# Patient Record
Sex: Female | Born: 1945 | Race: White | Hispanic: No | Smoking: Former smoker
Health system: Southern US, Community
[De-identification: ages and names within clinical notes are randomized; demographics above are authoritative.]

## PROBLEM LIST (undated history)

## (undated) DIAGNOSIS — I499 Cardiac arrhythmia, unspecified: Secondary | ICD-10-CM

## (undated) DIAGNOSIS — J449 Chronic obstructive pulmonary disease, unspecified: Secondary | ICD-10-CM

## (undated) DIAGNOSIS — J329 Chronic sinusitis, unspecified: Secondary | ICD-10-CM

## (undated) DIAGNOSIS — M1990 Unspecified osteoarthritis, unspecified site: Secondary | ICD-10-CM

## (undated) DIAGNOSIS — H9190 Unspecified hearing loss, unspecified ear: Secondary | ICD-10-CM

## (undated) DIAGNOSIS — B019 Varicella without complication: Secondary | ICD-10-CM

## (undated) DIAGNOSIS — J45909 Unspecified asthma, uncomplicated: Secondary | ICD-10-CM

## (undated) DIAGNOSIS — E785 Hyperlipidemia, unspecified: Secondary | ICD-10-CM

## (undated) DIAGNOSIS — I10 Essential (primary) hypertension: Secondary | ICD-10-CM

## (undated) DIAGNOSIS — N390 Urinary tract infection, site not specified: Secondary | ICD-10-CM

## (undated) DIAGNOSIS — J40 Bronchitis, not specified as acute or chronic: Secondary | ICD-10-CM

## (undated) DIAGNOSIS — R002 Palpitations: Secondary | ICD-10-CM

## (undated) DIAGNOSIS — K219 Gastro-esophageal reflux disease without esophagitis: Secondary | ICD-10-CM

## (undated) DIAGNOSIS — Z9889 Other specified postprocedural states: Secondary | ICD-10-CM

## (undated) HISTORY — DX: Urinary tract infection, site not specified: N39.0

## (undated) HISTORY — DX: Hyperlipidemia, unspecified: E78.5

## (undated) HISTORY — DX: Varicella without complication: B01.9

## (undated) HISTORY — DX: Essential (primary) hypertension: I10

---

## 2011-12-09 ENCOUNTER — Ambulatory Visit (INDEPENDENT_AMBULATORY_CARE_PROVIDER_SITE_OTHER): Payer: Medicare Other | Admitting: Cardiovascular Disease

## 2011-12-09 ENCOUNTER — Encounter: Payer: Self-pay | Admitting: Cardiovascular Disease

## 2011-12-09 VITALS — BP 120/90 | HR 96 | Ht 60.0 in | Wt 117.5 lb

## 2011-12-09 DIAGNOSIS — E785 Hyperlipidemia, unspecified: Secondary | ICD-10-CM | POA: Insufficient documentation

## 2011-12-09 DIAGNOSIS — R0602 Shortness of breath: Secondary | ICD-10-CM

## 2011-12-09 DIAGNOSIS — R079 Chest pain, unspecified: Secondary | ICD-10-CM | POA: Insufficient documentation

## 2011-12-09 DIAGNOSIS — I10 Essential (primary) hypertension: Secondary | ICD-10-CM

## 2011-12-09 NOTE — Patient Instructions (Addendum)
Your physician has requested that you have en exercise stress myoview. For further information please visit [email or web address] Please follow instruction sheet, as given.  Follow up after stress test.

## 2011-12-09 NOTE — Assessment & Plan Note (Signed)
Some of her symptoms are suggestive of gastroesophageal reflux disease. However, associated exertional dyspnea and decreased exercise tolerance is concerning for possible cardiac etiology. Her baseline ECG does not show any significant abnormalities. I recommend evaluation with a treadmill nuclear stress test.

## 2011-12-09 NOTE — Assessment & Plan Note (Signed)
Her blood pressure is now well controlled on small dose hydrochlorothiazide. I recommend continuing the same medication and dose. Her labs were overall unremarkable except for hyperlipidemia.

## 2011-12-09 NOTE — Assessment & Plan Note (Signed)
Your total cholesterol was 250 and LDL was 166. Most likely, the patient will require treatment with a statin. However, I would wait until after the stress test to determine how aggressive we need to be with this.

## 2011-12-09 NOTE — Progress Notes (Signed)
HPI  This is a pleasant 66 year old female who was referred by Dr. Loma Sender for evaluation of chest pain. The patient is not aware of any previous cardiac history. She does not go to see physicians on a regular basis. She had suffered from chest pain over the last few months which has been intermittent and overall mild. He was described as heartburn and aching sensation which usually responds to antacids. However, she had a prolonged episode last week that woke her up from sleep and lasted for more than one hour. That got her concerned. Since then, she has noticed fatigue and decreased exercise tolerance. Before the episode she was exercising at least 3 times a week. She is able to finish 3 miles in less than one hour. She is working part-time at [employer] center. She was hypertensive recently with a blood pressure of 170 systolic. She was started on small dose hydrochlorothiazide with improvement since then.  Allergies  Allergen Reactions  . Codeine     nausea     Current Outpatient Prescriptions on File Prior to Visit  Medication Sig Dispense Refill  . hydrochlorothiazide (HYDRODIURIL) 12.5 MG tablet Take 12.5 mg by mouth daily.      . Simethicone (MYLANTA GAS PO) Take by mouth as needed.         Past Medical History  Diagnosis Date  . Hyperlipidemia   . Hypertension      Past Surgical History  Procedure Date  . Cesarean section      Family History  Problem Relation Age of Onset  . Heart failure Mother   . Heart attack Father 67  . Heart attack Paternal Grandfather      History   Social History  . Marital Status: Married    Spouse Name: N/A    Number of Children: N/A  . Years of Education: N/A   Occupational History  . Not on file.   Social History Main Topics  . Smoking status: Former Smoker -- 1.0 packs/day for 25 years    Types: Cigarettes  . Smokeless tobacco: Not on file  . Alcohol Use: No  . Drug Use: No  . Sexually Active: Not on  file   Other Topics Concern  . Not on file   Social History Narrative  . No narrative on file     ROS Constitutional: Negative for fever, chills, diaphoresis, activity change, appetite change. HENT: Negative for hearing loss, nosebleeds, congestion, sore throat, facial swelling, drooling, trouble swallowing, neck pain, voice change, sinus pressure and tinnitus.  Eyes: Negative for photophobia, pain, discharge and visual disturbance.  Respiratory: Negative for apnea, cough and wheezing.  Cardiovascular: Negative for  palpitations and leg swelling.  Gastrointestinal: Negative for nausea, vomiting, abdominal pain, diarrhea, constipation, blood in stool and abdominal distention.  Genitourinary: Negative for dysuria, urgency, frequency, hematuria and decreased urine volume.  Musculoskeletal: Negative for myalgias, back pain, joint swelling, arthralgias and gait problem.  Skin: Negative for color change, pallor, rash and wound.  Neurological: Negative for dizziness, tremors, seizures, syncope, speech difficulty, weakness, light-headedness, numbness and headaches.  Psychiatric/Behavioral: Negative for suicidal ideas, hallucinations, behavioral problems and agitation. The patient is not nervous/anxious.     PHYSICAL EXAM   BP 120/90  Pulse 96  Ht 5' (1.524 m)  Wt 117 lb 8 oz (53.298 kg)  BMI 22.95 kg/m2 Constitutional: She is oriented to person, place, and time. She appears well-developed and well-nourished. No distress.  HENT: No nasal discharge.  Head:  Normocephalic and atraumatic.  Eyes: Pupils are equal and round. Right eye exhibits no discharge. Left eye exhibits no discharge.  Neck: Normal range of motion. Neck supple. No JVD present. No thyromegaly present.  Cardiovascular: Normal rate, regular rhythm, normal heart sounds. Exam reveals no gallop and no friction rub. No murmur heard.  Pulmonary/Chest: Effort normal and breath sounds normal. No stridor. No respiratory distress.  She has no wheezes. She has no rales. She exhibits no tenderness.  Abdominal: Soft. Bowel sounds are normal. She exhibits no distension. There is no tenderness. There is no rebound and no guarding.  Musculoskeletal: Normal range of motion. She exhibits no edema and no tenderness.  Neurological: She is alert and oriented to person, place, and time. Coordination normal.  Skin: Skin is warm and dry. No rash noted. She is not diaphoretic. No erythema. No pallor.  Psychiatric: She has a normal mood and affect. Her behavior is normal. Judgment and thought content normal.     ZOX:[id number]  Rhythm  -Left atrial enlargement.   BORDERLINE   ASSESSMENT AND PLAN

## 2011-12-20 ENCOUNTER — Ambulatory Visit: Payer: Self-pay | Admitting: Cardiovascular Disease

## 2011-12-20 DIAGNOSIS — R079 Chest pain, unspecified: Secondary | ICD-10-CM

## 2011-12-23 ENCOUNTER — Ambulatory Visit (INDEPENDENT_AMBULATORY_CARE_PROVIDER_SITE_OTHER): Payer: Medicare Other | Admitting: Cardiovascular Disease

## 2011-12-23 ENCOUNTER — Encounter: Payer: Self-pay | Admitting: Cardiovascular Disease

## 2011-12-23 VITALS — BP 122/78 | HR 82 | Ht 60.0 in | Wt 120.0 lb

## 2011-12-23 DIAGNOSIS — R079 Chest pain, unspecified: Secondary | ICD-10-CM

## 2011-12-23 DIAGNOSIS — I10 Essential (primary) hypertension: Secondary | ICD-10-CM

## 2011-12-23 DIAGNOSIS — E785 Hyperlipidemia, unspecified: Secondary | ICD-10-CM

## 2011-12-23 NOTE — Patient Instructions (Addendum)
Your stress test was normal.  Follow up as needed.  

## 2011-12-23 NOTE — Assessment & Plan Note (Signed)
Total cholesterol was 250 and LDL was 166. Given that she has no evidence of atherosclerosis so far, I think she can start with lifestyle modifications and evaluating this in 3-6 months to see if treatment is indicated. She reports strong family history of intolerance to statins and she is hesitant to go on any of these medications.

## 2011-12-23 NOTE — Assessment & Plan Note (Signed)
Her blood pressure is now well controlled on small dose hydrochlorothiazide.

## 2011-12-23 NOTE — Assessment & Plan Note (Addendum)
It was likely due to GERD. Her symptoms resolved. Cardiac stress test was normal. No further cardiac testing is indicated. I discussed with the patient the importance of lifestyle changes in order to decrease the chance of future coronary artery disease and cardiovascular events. We discussed the importance of controlling risk factors, healthy diet as well as regular exercise. Followup with me as needed.

## 2011-12-23 NOTE — Progress Notes (Signed)
    HPI  This is a pleasant 66 year old female who is here today for a followup visit regarding chest pain. The patient has no previous cardiac history.  She had chest pain recently that was suggestive of GERD. She was hypertensive and was started on small dose hydrochlorothiazide with subsequent improvement. She underwent a treadmill nuclear stress test which showed no evidence of ischemia with normal ejection fraction. She was able to exercise for 6-1/2 minutes without any chest pain or ischemic ECG changes. She reports no further chest pain. She has mild dyspnea which has not changed from before.  Allergies  Allergen Reactions  . Codeine     nausea     Current Outpatient Prescriptions on File Prior to Visit  Medication Sig Dispense Refill  . aspirin 81 MG tablet Take 81 mg by mouth daily.      . hydrochlorothiazide (HYDRODIURIL) 12.5 MG tablet Take 12.5 mg by mouth daily.      . Simethicone (MYLANTA GAS PO) Take by mouth as needed.         Past Medical History  Diagnosis Date  . Hyperlipidemia   . Hypertension      Past Surgical History  Procedure Date  . Cesarean section      Family History  Problem Relation Age of Onset  . Heart failure Mother   . Heart attack Father 53  . Heart attack Paternal Grandfather      History   Social History  . Marital Status: Married    Spouse Name: N/A    Number of Children: N/A  . Years of Education: N/A   Occupational History  . Not on file.   Social History Main Topics  . Smoking status: Former Smoker -- 1.0 packs/day for 25 years    Types: Cigarettes  . Smokeless tobacco: Not on file  . Alcohol Use: No  . Drug Use: No  . Sexually Active: Not on file   Other Topics Concern  . Not on file   Social History Narrative  . No narrative on file     PHYSICAL EXAM   BP 122/78  Pulse 82  Ht 5' (1.524 m)  Wt 120 lb (54.432 kg)  BMI 23.44 kg/m2  SpO2 98% Constitutional: She is oriented to person, place, and time.  She appears well-developed and well-nourished. No distress.  HENT: No nasal discharge.  Head: Normocephalic and atraumatic.  Eyes: Pupils are equal and round. Right eye exhibits no discharge. Left eye exhibits no discharge.  Neck: Normal range of motion. Neck supple. No JVD present. No thyromegaly present.  Cardiovascular: Normal rate, regular rhythm, normal heart sounds. Exam reveals no gallop and no friction rub. No murmur heard.  Pulmonary/Chest: Effort normal and breath sounds normal. No stridor. No respiratory distress. She has no wheezes. She has no rales. She exhibits no tenderness.  Abdominal: Soft. Bowel sounds are normal. She exhibits no distension. There is no tenderness. There is no rebound and no guarding.  Musculoskeletal: Normal range of motion. She exhibits no edema and no tenderness.  Neurological: She is alert and oriented to person, place, and time. Coordination normal.  Skin: Skin is warm and dry. No rash noted. She is not diaphoretic. No erythema. No pallor.  Psychiatric: She has a normal mood and affect. Her behavior is normal. Judgment and thought content normal.      ASSESSMENT AND PLAN

## 2012-01-10 ENCOUNTER — Other Ambulatory Visit: Payer: Self-pay | Admitting: Cardiovascular Disease

## 2012-01-10 DIAGNOSIS — R079 Chest pain, unspecified: Secondary | ICD-10-CM

## 2013-06-25 DIAGNOSIS — I10 Essential (primary) hypertension: Secondary | ICD-10-CM | POA: Diagnosis not present

## 2013-06-25 DIAGNOSIS — T887XXA Unspecified adverse effect of drug or medicament, initial encounter: Secondary | ICD-10-CM | POA: Diagnosis not present

## 2013-06-25 DIAGNOSIS — E785 Hyperlipidemia, unspecified: Secondary | ICD-10-CM | POA: Diagnosis not present

## 2013-09-24 DIAGNOSIS — E785 Hyperlipidemia, unspecified: Secondary | ICD-10-CM | POA: Diagnosis not present

## 2013-12-31 DIAGNOSIS — I10 Essential (primary) hypertension: Secondary | ICD-10-CM | POA: Diagnosis not present

## 2013-12-31 DIAGNOSIS — T7800XA Anaphylactic reaction due to unspecified food, initial encounter: Secondary | ICD-10-CM | POA: Diagnosis not present

## 2013-12-31 DIAGNOSIS — E782 Mixed hyperlipidemia: Secondary | ICD-10-CM | POA: Diagnosis not present

## 2013-12-31 DIAGNOSIS — E785 Hyperlipidemia, unspecified: Secondary | ICD-10-CM | POA: Diagnosis not present

## 2013-12-31 DIAGNOSIS — T887XXA Unspecified adverse effect of drug or medicament, initial encounter: Secondary | ICD-10-CM | POA: Diagnosis not present

## 2014-04-18 DIAGNOSIS — J019 Acute sinusitis, unspecified: Secondary | ICD-10-CM | POA: Diagnosis not present

## 2014-04-27 DIAGNOSIS — I10 Essential (primary) hypertension: Secondary | ICD-10-CM | POA: Diagnosis not present

## 2014-04-27 DIAGNOSIS — E789 Disorder of lipoprotein metabolism, unspecified: Secondary | ICD-10-CM | POA: Diagnosis not present

## 2014-06-13 DIAGNOSIS — N390 Urinary tract infection, site not specified: Secondary | ICD-10-CM | POA: Diagnosis not present

## 2014-07-28 DIAGNOSIS — E119 Type 2 diabetes mellitus without complications: Secondary | ICD-10-CM | POA: Diagnosis not present

## 2014-07-28 DIAGNOSIS — I10 Essential (primary) hypertension: Secondary | ICD-10-CM | POA: Diagnosis not present

## 2014-07-28 DIAGNOSIS — E785 Hyperlipidemia, unspecified: Secondary | ICD-10-CM | POA: Diagnosis not present

## 2014-07-28 DIAGNOSIS — R740 Nonspecific elevation of levels of transaminase and lactic acid dehydrogenase [LDH]: Secondary | ICD-10-CM | POA: Diagnosis not present

## 2014-10-27 DIAGNOSIS — E785 Hyperlipidemia, unspecified: Secondary | ICD-10-CM | POA: Diagnosis not present

## 2014-10-27 DIAGNOSIS — E119 Type 2 diabetes mellitus without complications: Secondary | ICD-10-CM | POA: Diagnosis not present

## 2014-10-27 DIAGNOSIS — R945 Abnormal results of liver function studies: Secondary | ICD-10-CM | POA: Diagnosis not present

## 2014-10-27 DIAGNOSIS — I10 Essential (primary) hypertension: Secondary | ICD-10-CM | POA: Diagnosis not present

## 2014-11-04 DIAGNOSIS — J111 Influenza due to unidentified influenza virus with other respiratory manifestations: Secondary | ICD-10-CM | POA: Diagnosis not present

## 2014-11-10 DIAGNOSIS — I10 Essential (primary) hypertension: Secondary | ICD-10-CM | POA: Diagnosis not present

## 2014-11-10 DIAGNOSIS — E784 Other hyperlipidemia: Secondary | ICD-10-CM | POA: Diagnosis not present

## 2015-03-20 DIAGNOSIS — I10 Essential (primary) hypertension: Secondary | ICD-10-CM | POA: Diagnosis not present

## 2015-03-20 DIAGNOSIS — E785 Hyperlipidemia, unspecified: Secondary | ICD-10-CM | POA: Diagnosis not present

## 2015-07-31 ENCOUNTER — Ambulatory Visit (INDEPENDENT_AMBULATORY_CARE_PROVIDER_SITE_OTHER): Payer: Medicare Other | Admitting: Family Medicine

## 2015-07-31 ENCOUNTER — Encounter: Payer: Self-pay | Admitting: Family Medicine

## 2015-07-31 VITALS — BP 144/92 | HR 85 | Temp 98.5°F | Ht <= 58 in | Wt 122.0 lb

## 2015-07-31 DIAGNOSIS — I10 Essential (primary) hypertension: Secondary | ICD-10-CM

## 2015-07-31 DIAGNOSIS — N393 Stress incontinence (female) (male): Secondary | ICD-10-CM | POA: Diagnosis not present

## 2015-07-31 DIAGNOSIS — E785 Hyperlipidemia, unspecified: Secondary | ICD-10-CM | POA: Diagnosis not present

## 2015-07-31 DIAGNOSIS — Z23 Encounter for immunization: Secondary | ICD-10-CM | POA: Diagnosis not present

## 2015-07-31 LAB — COMPREHENSIVE METABOLIC PANEL
ALK PHOS: 75 U/L (ref 39–117)
ALT: 19 U/L (ref 0–35)
AST: 27 U/L (ref 0–37)
Albumin: 4.1 g/dL (ref 3.5–5.2)
BILIRUBIN TOTAL: 0.5 mg/dL (ref 0.2–1.2)
BUN: 15 mg/dL (ref 6–23)
CO2: 26 mEq/L (ref 19–32)
CREATININE: 0.63 mg/dL (ref 0.40–1.20)
Calcium: 9.3 mg/dL (ref 8.4–10.5)
Chloride: 106 mEq/L (ref 96–112)
GFR: 99.55 mL/min (ref >60.00)
GLUCOSE: 92 mg/dL (ref 70–99)
Potassium: 3.6 mEq/L (ref 3.5–5.1)
Sodium: 140 mEq/L (ref 135–145)
TOTAL PROTEIN: 6.9 g/dL (ref 6.0–8.3)

## 2015-07-31 MED ORDER — HYDROCHLOROTHIAZIDE 12.5 MG PO CAPS: 12.5000 mg | ORAL_CAPSULE | Freq: Every day | ORAL | Status: DC

## 2015-07-31 MED ORDER — ATORVASTATIN CALCIUM 40 MG PO TABS: 40.0000 mg | ORAL_TABLET | Freq: Every day | ORAL | Status: DC

## 2015-07-31 NOTE — Patient Instructions (Signed)
Nice to meet you. We'll restart you on hydrochlorothiazide for your blood pressure. We will start you on a cholesterol medicine as well. We'll check some lab work today and then heavy return for a cholesterol panel at her convenience. Please continue to monitor your stress incontinence. She can try kegel exercises as listed below. If you develop chest pain or shortness of breath, swelling, abdominal pain, burning with urination, fever, or any new or changing symptoms please seek medical attention.  Kegel Exercises The goal of Kegel exercises is to isolate and exercise your pelvic floor muscles. These muscles act as a hammock that supports the rectum, vagina, small intestine, and uterus. As the muscles weaken, the hammock sags and these organs are displaced from their normal positions. Kegel exercises can strengthen your pelvic floor muscles and help you to improve bladder and bowel control, improve sexual response, and help reduce many problems and some discomfort during pregnancy. Kegel exercises can be done anywhere and at any time. HOW TO PERFORM KEGEL EXERCISES 1. Locate your pelvic floor muscles. To do this, squeeze (contract) the muscles that you use when you try to stop the flow of urine. You will feel a tightness in the vaginal area (women) and a tight lift in the rectal area (men and women). 2. When you begin, contract your pelvic muscles tight for 2-5 seconds, then relax them for 2-5 seconds. This is one set. Do 4-5 sets with a short pause in between. 3. Contract your pelvic muscles for 8-10 seconds, then relax them for 8-10 seconds. Do 4-5 sets. If you cannot contract your pelvic muscles for 8-10 seconds, try 5-7 seconds and work your way up to 8-10 seconds. Your goal is 4-5 sets of 10 contractions each day. Keep your stomach, buttocks, and legs relaxed during the exercises. Perform sets of both short and long contractions. Vary your positions. Perform these contractions 3-4 times per day.  Perform sets while you are:   Lying in bed in the morning.  Standing at lunch.  Sitting in the late afternoon.  Lying in bed at night. You should do 40-50 contractions per day. Do not perform more Kegel exercises per day than recommended. Overexercising can cause muscle fatigue. Continue these exercises for for at least 15-20 weeks or as directed by your caregiver.   This information is not intended to replace advice given to you by your health care provider. Make sure you discuss any questions you have with your health care provider.   Document Released: 05/13/2012 Document Revised: 06/17/2014 Document Reviewed: 05/13/2012 Elsevier Interactive Patient Education [employer]

## 2015-07-31 NOTE — Progress Notes (Signed)
Pre visit review using our clinic review tool, if applicable. No additional management support is needed unless otherwise documented below in the visit note. 

## 2015-07-31 NOTE — Assessment & Plan Note (Addendum)
History consistent with stress incontinence. Benign abdominal exam. No UTI symptoms. Discussed Kegel exercises. She will continue to monitor. Given return precautions.

## 2015-07-31 NOTE — Assessment & Plan Note (Addendum)
Reviewed patient's most recent cholesterol panels. Has a history of LDL cholesterol in the 180s and 190s. Will start on Lipitor and check a CMP.

## 2015-07-31 NOTE — Progress Notes (Signed)
Patient ID: Sydney Day, female   DOB: 1946-04-19, 70 y.o.   MRN: [id number]  Tommi Rumps, MD Phone: [phone]  Sydney Day is a 70 y.o. female who presents today for new patient visit.  HYPERTENSION Disease Monitoring Home BP Monitoring not checking Chest pain- no    Dyspnea- no Medications Compliance-  not currently taking any medications. Stopped taking her medication about a month ago. She was on HCTZ previously. Lightheadedness-  no  Edema- no  HYPERLIPIDEMIA Symptoms Chest pain on exertion:  No   Leg claudication:   No Not currently taking any medications. Stopped her medications a month ago to start fresh with a new doctor.  Stress incontinence: Patient notes issues with leaking small amounts of urine when laughing or when rushing for many years. She had 2 children. She denies any other urinary complaints or abdominal pain. Does have a history of UTIs and no recent UTIs. Tried Kegel exercises in the past.    Active Ambulatory Problems    Diagnosis Date Noted  . Hyperlipidemia   . Hypertension   . Chest pain 12/09/2011  . Essential hypertension 07/31/2015  . Stress incontinence 07/31/2015   Resolved Ambulatory Problems    Diagnosis Date Noted  . No Resolved Ambulatory Problems   Past Medical History  Diagnosis Date  . Chickenpox   . UTI (lower urinary tract infection)     Family History  Problem Relation Age of Onset  . Heart failure Mother   . Heart attack Father 50  . Heart attack Paternal Grandfather   . Arthritis    . Breast cancer    . Hypertension    . Hyperlipidemia      Social History   Social History  . Marital Status: Married    Spouse Name: N/A  . Number of Children: N/A  . Years of Education: N/A   Occupational History  . Not on file.   Social History Main Topics  . Smoking status: Former Smoker -- 1.00 packs/day for 25 years    Types: Cigarettes  . Smokeless tobacco: Not on file  . Alcohol Use: No  . Drug Use: No    . Sexual Activity: Not on file   Other Topics Concern  . Not on file   Social History Narrative    ROS   General:  Negative for unexplained weight loss, fever Skin: Negative for new or changing mole, sore that won't heal HEENT: Negative for trouble hearing, trouble seeing, ringing in ears, mouth sores, hoarseness, change in voice, dysphagia. CV:  Negative for chest pain, dyspnea, edema, palpitations Resp: Negative for cough, dyspnea, hemoptysis GI: Negative for nausea, vomiting, diarrhea, constipation, abdominal pain, melena, hematochezia. GU: Positive for stress incontinence, Negative for dysuria, urinary hesitance, hematuria, vaginal or penile discharge, polyuria, sexual difficulty, lumps in testicle or breasts MSK: Positive for joint aches, Negative for muscle cramps or aches, joint swelling Neuro: Negative for headaches, weakness, numbness, dizziness, passing out/fainting Psych: Negative for depression, anxiety, memory problems  Objective  Physical Exam Filed Vitals:   07/31/15 1324 07/31/15 1351  BP: 158/76 144/92  Pulse: 85   Temp: 98.5 F (36.9 C)     BP Readings from Last 3 Encounters:  07/31/15 144/92  12/23/11 122/78  12/09/11 120/90   Wt Readings from Last 3 Encounters:  07/31/15 122 lb (55.339 kg)  12/23/11 120 lb (54.432 kg)  12/09/11 117 lb 8 oz (53.298 kg)    Physical Exam  Constitutional: She is well-developed, well-nourished, and in  no distress.  HENT:  Head: Normocephalic and atraumatic.  Right Ear: External ear normal.  Left Ear: External ear normal.  Mouth/Throat: Oropharynx is clear and moist. No oropharyngeal exudate.  Eyes: Conjunctivae are normal. Pupils are equal, round, and reactive to light.  Neck: Neck supple.  Cardiovascular: Normal rate, regular rhythm and normal heart sounds.   Pulmonary/Chest: Effort normal and breath sounds normal.  Abdominal: Soft. Bowel sounds are normal. She exhibits no distension. There is no tenderness.  There is no rebound and no guarding.  Musculoskeletal: She exhibits no edema.  Lymphadenopathy:    She has no cervical adenopathy.  Neurological: She is alert. Gait normal.  Skin: Skin is warm and dry. She is not diaphoretic.  Psychiatric: Mood and affect normal.     Assessment/Plan:   Essential hypertension Not controlled. Will restart on hydrochlorothiazide 12.5 mg daily. We'll check lab work today.  Hyperlipidemia Reviewed patient's most recent cholesterol panels. Has a history of LDL cholesterol in the 180s and 190s. Will start on Lipitor and check a CMP.  Stress incontinence History consistent with stress incontinence. Benign abdominal exam. No UTI symptoms. Discussed Kegel exercises. She will continue to monitor. Given return precautions.    Orders Placed This Encounter  Procedures  . Pneumococcal conjugate vaccine 13-valent  . Comp Met (CMET)  . Lipid Profile    Standing Status: Future     Number of Occurrences:      Standing Expiration Date: 07/30/2016    Meds ordered this encounter  Medications  . hydrochlorothiazide (MICROZIDE) 12.5 MG capsule    Sig: Take 1 capsule (12.5 mg total) by mouth daily.    Dispense:  90 capsule    Refill:  3  . atorvastatin (LIPITOR) 40 MG tablet    Sig: Take 1 tablet (40 mg total) by mouth daily.    Dispense:  90 tablet    Refill:  3   Health maintenance: Patient given stool cards today. Declines mammogram. Declines colonoscopy. Also given Prevnar.  Tommi Rumps

## 2015-07-31 NOTE — Assessment & Plan Note (Signed)
Not controlled. Will restart on hydrochlorothiazide 12.5 mg daily. We'll check lab work today.

## 2015-08-01 ENCOUNTER — Encounter: Payer: Self-pay | Admitting: Family Medicine

## 2015-08-05 ENCOUNTER — Telehealth: Payer: Self-pay | Admitting: *Deleted

## 2015-08-05 NOTE — Telephone Encounter (Signed)
Pt coming in Monday for labs. Note states lipid & CMET. Only see order for lipid.

## 2015-08-06 ENCOUNTER — Other Ambulatory Visit: Payer: Self-pay | Admitting: Nurse Practitioner

## 2015-08-06 DIAGNOSIS — Z5181 Encounter for therapeutic drug level monitoring: Secondary | ICD-10-CM

## 2015-08-06 DIAGNOSIS — Z79899 Other long term (current) drug therapy: Principal | ICD-10-CM

## 2015-08-06 NOTE — Telephone Encounter (Signed)
I have added the CMET for both to be obtained tomorrow. Thanks! I will be following his inbox and labs for the next week.

## 2015-08-07 ENCOUNTER — Other Ambulatory Visit (INDEPENDENT_AMBULATORY_CARE_PROVIDER_SITE_OTHER): Payer: Medicare Other

## 2015-08-07 DIAGNOSIS — Z79899 Other long term (current) drug therapy: Secondary | ICD-10-CM

## 2015-08-07 DIAGNOSIS — Z5181 Encounter for therapeutic drug level monitoring: Secondary | ICD-10-CM

## 2015-08-07 DIAGNOSIS — E785 Hyperlipidemia, unspecified: Secondary | ICD-10-CM | POA: Diagnosis not present

## 2015-08-07 LAB — COMPREHENSIVE METABOLIC PANEL
ALBUMIN: 4.3 g/dL (ref 3.5–5.2)
ALK PHOS: 94 U/L (ref 39–117)
ALT: 20 U/L (ref 0–35)
AST: 28 U/L (ref 0–37)
BUN: 10 mg/dL (ref 6–23)
CHLORIDE: 102 meq/L (ref 96–112)
CO2: 32 mEq/L (ref 19–32)
CREATININE: 0.62 mg/dL (ref 0.40–1.20)
Calcium: 9.3 mg/dL (ref 8.4–10.5)
GFR: 101.4 mL/min (ref >60.00)
Glucose, Bld: 106 mg/dL — ABNORMAL HIGH (ref 70–99)
Potassium: 3.3 mEq/L — ABNORMAL LOW (ref 3.5–5.1)
SODIUM: 140 meq/L (ref 135–145)
TOTAL PROTEIN: 7.2 g/dL (ref 6.0–8.3)
Total Bilirubin: 0.7 mg/dL (ref 0.2–1.2)

## 2015-08-07 LAB — LIPID PANEL
CHOLESTEROL: 218 mg/dL — AB (ref 0–200)
HDL: 72.4 mg/dL (ref >39.00)
LDL CALC: 132 mg/dL — AB (ref 0–99)
NONHDL: 145.97
Total CHOL/HDL Ratio: 3
Triglycerides: 69 mg/dL (ref 0.0–149.0)
VLDL: 13.8 mg/dL (ref 0.0–40.0)

## 2015-08-08 ENCOUNTER — Encounter

## 2015-08-09 ENCOUNTER — Telehealth: Payer: Self-pay | Admitting: Family Medicine

## 2015-08-09 NOTE — Telephone Encounter (Signed)
Pt called returning your call for lab results. Call pt @ [phone]. Thank you!

## 2015-08-09 NOTE — Telephone Encounter (Signed)
Spoke with patient about lab results. She stated that she starting having muscle aches and elevated pulse rate with taking the atorvastatin. She will discuss with Dr. Birdie Sons at next appointment

## 2015-08-10 ENCOUNTER — Ambulatory Visit (INDEPENDENT_AMBULATORY_CARE_PROVIDER_SITE_OTHER): Payer: Medicare Other

## 2015-08-10 ENCOUNTER — Other Ambulatory Visit: Payer: Self-pay | Admitting: Nurse Practitioner

## 2015-08-10 VITALS — BP 136/86 | HR 86

## 2015-08-10 DIAGNOSIS — I10 Essential (primary) hypertension: Secondary | ICD-10-CM

## 2015-08-10 DIAGNOSIS — E876 Hypokalemia: Secondary | ICD-10-CM

## 2015-08-10 NOTE — Progress Notes (Signed)
Patient was in the office receiving a BP check. Patient got it checked in both arms.

## 2015-08-14 NOTE — Telephone Encounter (Signed)
The patient should be advised to hold the atorvastatin if she has not already stopped this. We will need to discuss these issues in the office if they have not improved.

## 2015-08-14 NOTE — Progress Notes (Signed)
Patient ID: Sydney SpruceVirginia D Day, female   DOB: 07/01/1945, 70 y.o.   MRN: [id number] BP is at goal. Will continue with current plan of care.

## 2015-08-15 NOTE — Telephone Encounter (Signed)
Noted  

## 2015-08-15 NOTE — Telephone Encounter (Signed)
Spoke with patient and she had discontinued the medication when she started having the symptoms. She will discuss a different medication during her next visit.

## 2015-08-16 ENCOUNTER — Other Ambulatory Visit (INDEPENDENT_AMBULATORY_CARE_PROVIDER_SITE_OTHER): Payer: Medicare Other

## 2015-08-16 DIAGNOSIS — E876 Hypokalemia: Secondary | ICD-10-CM | POA: Diagnosis not present

## 2015-08-16 LAB — POTASSIUM: Potassium: 3.9 mEq/L (ref 3.5–5.1)

## 2015-08-28 ENCOUNTER — Ambulatory Visit (INDEPENDENT_AMBULATORY_CARE_PROVIDER_SITE_OTHER): Payer: Medicare Other | Admitting: Family Medicine

## 2015-08-28 ENCOUNTER — Encounter: Payer: Self-pay | Admitting: Family Medicine

## 2015-08-28 VITALS — BP 126/84 | HR 80 | Temp 98.2°F | Ht <= 58 in | Wt 120.8 lb

## 2015-08-28 DIAGNOSIS — I10 Essential (primary) hypertension: Secondary | ICD-10-CM | POA: Diagnosis not present

## 2015-08-28 DIAGNOSIS — M791 Myalgia, unspecified site: Secondary | ICD-10-CM

## 2015-08-28 DIAGNOSIS — E785 Hyperlipidemia, unspecified: Secondary | ICD-10-CM

## 2015-08-28 DIAGNOSIS — R002 Palpitations: Secondary | ICD-10-CM | POA: Diagnosis not present

## 2015-08-28 LAB — CBC
HCT: 42.8 % (ref 36.0–46.0)
Hemoglobin: 14.2 g/dL (ref 12.0–15.0)
MCHC: 33.2 g/dL (ref 30.0–36.0)
MCV: 85.1 fl (ref 78.0–100.0)
PLATELETS: 340 10*3/uL (ref 150.0–400.0)
RBC: 5.03 Mil/uL (ref 3.87–5.11)
RDW: 14 % (ref 11.5–15.5)
WBC: 8 10*3/uL (ref 4.0–10.5)

## 2015-08-28 LAB — COMPREHENSIVE METABOLIC PANEL
ALBUMIN: 4.3 g/dL (ref 3.5–5.2)
ALK PHOS: 87 U/L (ref 39–117)
ALT: 24 U/L (ref 0–35)
AST: 30 U/L (ref 0–37)
BILIRUBIN TOTAL: 0.8 mg/dL (ref 0.2–1.2)
BUN: 15 mg/dL (ref 6–23)
CO2: 30 mEq/L (ref 19–32)
Calcium: 9.6 mg/dL (ref 8.4–10.5)
Chloride: 100 mEq/L (ref 96–112)
Creatinine, Ser: 0.61 mg/dL (ref 0.40–1.20)
GFR: 103.3 mL/min (ref >60.00)
GLUCOSE: 80 mg/dL (ref 70–99)
POTASSIUM: 3.8 meq/L (ref 3.5–5.1)
SODIUM: 139 meq/L (ref 135–145)
TOTAL PROTEIN: 6.9 g/dL (ref 6.0–8.3)

## 2015-08-28 LAB — TSH: TSH: 2.57 u[IU]/mL (ref 0.35–4.50)

## 2015-08-28 LAB — CK: CK TOTAL: 40 U/L (ref 7–177)

## 2015-08-28 MED ORDER — SIMVASTATIN 20 MG PO TABS: 20.0000 mg | ORAL_TABLET | Freq: Every day | ORAL | Status: DC

## 2015-08-28 NOTE — Assessment & Plan Note (Addendum)
ASCVD  risk percentage of 10.7 placing her in the statin benefit group. She has not tolerated Lipitor 40 mg. Suspect this is the likely cause of her muscle aches and possible palpitations. She had similar reaction to an increased dose of simvastatin the past. Has tolerated lower dose simvastatin the past. We will trial her on this given her risk score. We'll check a CK as well.

## 2015-08-28 NOTE — Progress Notes (Signed)
Pre visit review using our clinic review tool, if applicable. No additional management support is needed unless otherwise documented below in the visit note. 

## 2015-08-28 NOTE — Progress Notes (Signed)
Patient ID: [name], female   DOB: Aug 27, 1945, 70 y.o.   MRN: [id number]  Tommi Rumps, MD Phone: [phone]  Sydney Day is a 70 y.o. female who presents today for follow-up.  HYPERTENSION Disease Monitoring Home BP Monitoring yes, averages 120s over 80s, highest 152/94 Chest pain- no    Dyspnea- no Medications Compliance-  taking HCTZ.  Edema- no  HYPERLIPIDEMIA Symptoms Chest pain on exertion:  No   Leg claudication:   No Medications: Compliance- was on Lipitor, though stopped this due to muscle aches Right upper quadrant pain- no  Muscle aches- yes, notes muscle aches started 4 days after starting on Lipitor. She additionally notes she had some mild heart fluttering at the same time that the muscle aches started. Fluttering went away quickly. The muscle aches resolved after 2 days. Her blood pressure at that time had been 160/90. She did check her pulse and the highest was 105. Has not had a recurrence of heart fluttering or muscle aches since stopping the Lipitor. She notes she had a similar reaction when her prior physician tried to increase her simvastatin to 40 mg daily. She has tolerated simvastatin 20 mg daily with no issues in the past.  Family history of pernicious anemia: Patient reports her entire family has pernicious anemia. No recent CBC checked.  Also notes a family history of osteoporosis. She has not had a DEXA scan in the past.   PMH: Former smoker   ROS see history of present illness  Objective  Physical Exam Filed Vitals:   08/28/15 1053  BP: 126/84  Pulse: 80  Temp: 98.2 F (36.8 C)    BP Readings from Last 3 Encounters:  08/28/15 126/84  08/10/15 136/86  07/31/15 144/92   Wt Readings from Last 3 Encounters:  08/28/15 120 lb 12.8 oz (54.795 kg)  07/31/15 122 lb (55.339 kg)  12/23/11 120 lb (54.432 kg)    Physical Exam  Constitutional: She is well-developed, well-nourished, and in no distress.  HENT:  Head: Normocephalic  and atraumatic.  Right Ear: External ear normal.  Left Ear: External ear normal.  Mouth/Throat: Oropharynx is clear and moist. No oropharyngeal exudate.  Eyes: Conjunctivae are normal. Pupils are equal, round, and reactive to light.  Neck: Neck supple. No thyromegaly present.  Cardiovascular: Normal rate, regular rhythm and normal heart sounds.  Exam reveals no gallop and no friction rub.   No murmur heard. Pulmonary/Chest: Effort normal and breath sounds normal. No respiratory distress. She has no wheezes. She has no rales.  Lymphadenopathy:    She has no cervical adenopathy.  Neurological: She is alert. Gait normal.  Skin: Skin is warm and dry. She is not diaphoretic.     Assessment/Plan: Please see individual problem list.  Essential hypertension At goal. Continue HCTZ. CMP checked today.  Hyperlipidemia ASCVD  risk percentage of 10.7 placing her in the statin benefit group. She has not tolerated Lipitor 40 mg. Suspect this is the likely cause of her muscle aches and possible palpitations. She had similar reaction to an increased dose of simvastatin the past. Has tolerated lower dose simvastatin the past. We will trial her on this given her risk score. We'll check a CK as well.  Palpitations Minimal palpitations following initiation of Lipitor. Patient reports having had similar symptoms after increasing her dose of simvastatin in the past. Symptoms likely related to Lipitor. I discussed obtaining an EKG to evaluate this further, though she deferred this at this time opting to see if  her symptoms recurred. She does have a family history of pernicious anemia and thus we will check a CBC. We will also  and check CMP and TSH to evaluate for other causes. she'll continue to monitor. If they recur she will let us know. She is given return precautions.     Orders Placed This Encounter  Procedures  . CBC  . CK (Creatine Kinase)  . Comp Met (CMET)  . TSH    Meds ordered this encounter   Medications  . simvastatin (ZOCOR) 20 MG tablet    Sig: Take 1 tablet (20 mg total) by mouth at bedtime.    Dispense:  90 tablet    Refill:  3    # Healthcare maintenance: Declined DEXA scan.   Tommi Rumps, MD [place]

## 2015-08-28 NOTE — Assessment & Plan Note (Signed)
At goal. Continue HCTZ. CMP checked today.

## 2015-08-28 NOTE — Patient Instructions (Addendum)
Nice to see you. Your blood pressure is under good control. Please continue the HCTZ. We will change her to simvastatin issue tolerated this in the past. Please monitor for muscle aches and recurrent heart rate increases. If these occur please let us know immediately. We will check some lab work as well today. If you develop chest pain, shortness of breath, palpitations, muscle aches, abdominal pain, or any new or changing symptoms please seek medical attention.

## 2015-08-28 NOTE — Assessment & Plan Note (Signed)
Minimal palpitations following initiation of Lipitor. Patient reports having had similar symptoms after increasing her dose of simvastatin in the past. Symptoms likely related to Lipitor. I discussed obtaining an EKG to evaluate this further, though she deferred this at this time opting to see if her symptoms recurred. She does have a family history of pernicious anemia and thus we will check a CBC. We will also  and check CMP and TSH to evaluate for other causes. she'll continue to monitor. If they recur she will let us know. She is given return precautions.

## 2015-09-28 ENCOUNTER — Ambulatory Visit (INDEPENDENT_AMBULATORY_CARE_PROVIDER_SITE_OTHER): Payer: Medicare Other | Admitting: Family Medicine

## 2015-09-28 ENCOUNTER — Encounter: Payer: Self-pay | Admitting: Family Medicine

## 2015-09-28 VITALS — BP 130/86 | HR 73 | Temp 98.3°F | Ht 69.5 in | Wt 122.0 lb

## 2015-09-28 DIAGNOSIS — E785 Hyperlipidemia, unspecified: Secondary | ICD-10-CM | POA: Diagnosis not present

## 2015-09-28 DIAGNOSIS — H6982 Other specified disorders of Eustachian tube, left ear: Secondary | ICD-10-CM | POA: Diagnosis not present

## 2015-09-28 DIAGNOSIS — R002 Palpitations: Secondary | ICD-10-CM | POA: Diagnosis not present

## 2015-09-28 LAB — COMPREHENSIVE METABOLIC PANEL
ALK PHOS: 84 U/L (ref 39–117)
ALT: 20 U/L (ref 0–35)
AST: 25 U/L (ref 0–37)
Albumin: 4.1 g/dL (ref 3.5–5.2)
BUN: 13 mg/dL (ref 6–23)
CHLORIDE: 103 meq/L (ref 96–112)
CO2: 31 mEq/L (ref 19–32)
Calcium: 9.5 mg/dL (ref 8.4–10.5)
Creatinine, Ser: 0.68 mg/dL (ref 0.40–1.20)
GFR: 91.11 mL/min (ref >60.00)
GLUCOSE: 94 mg/dL (ref 70–99)
POTASSIUM: 3.8 meq/L (ref 3.5–5.1)
SODIUM: 140 meq/L (ref 135–145)
TOTAL PROTEIN: 6.9 g/dL (ref 6.0–8.3)
Total Bilirubin: 0.5 mg/dL (ref 0.2–1.2)

## 2015-09-28 LAB — LDL CHOLESTEROL, DIRECT: Direct LDL: 135 mg/dL

## 2015-09-28 NOTE — Assessment & Plan Note (Addendum)
No recurrence. Suspect this was related to the Lipitor. Continue to monitor. Given return precautions.

## 2015-09-28 NOTE — Patient Instructions (Signed)
Nice to see you. Please try over-the-counter Claritin and Flonase for your left ear. We will continue your current simvastatin dose and check lab work today. If you develop worsening ear discomfort, decreased hearing, chest pain, shortness breath, palpitations, or any new or changing symptoms please seek medical attention.

## 2015-09-28 NOTE — Assessment & Plan Note (Signed)
Tolerating simvastatin. We will check a liver function tests and direct LDL. She'll continue current dosing.

## 2015-09-28 NOTE — Progress Notes (Signed)
Pre visit review using our clinic review tool, if applicable. No additional management support is needed unless otherwise documented below in the visit note. 

## 2015-09-28 NOTE — Assessment & Plan Note (Signed)
Suspect the discomfort in her left ear is likely related to eustachian tube dysfunction following recent sinus infection. Her other symptoms have resolved though she notes occasional discomfort in her left ear. Benign exam. Discussed starting on Flonase and Claritin. She'll pick these up over-the-counter. She'll continue to monitor. If not improving in the next 1-2 weeks she'll follow-up in the office. Given return precautions.

## 2015-09-28 NOTE — Progress Notes (Signed)
Patient ID: Sydney Day, female   DOB: Nov 06, 1945, 71 y.o.   MRN: [id number]  Tommi Rumps, MD Phone: [phone]  Sydney Day is a 70 y.o. female who presents today for follow-up.  Left ear pain: Patient notes on Monday her left ear started to throb occasionally. She notes the last several weeks she had had a sinus infection with congestion and runny nose that this is improved. She notes no new tinnitus. No hearing difficulties. No fevers. The pain is not constant. She's been taking aspirin twice a day 650 mg for this. She's been using over-the-counter eardrops.  HYPERLIPIDEMIA Symptoms Chest pain on exertion:  No   Leg claudication:   No Medications: Compliance- taking simvastatin 20 mg daily Right upper quadrant pain- no  Muscle aches- no  Patient notes no recurrence of her palpitations. She notes no chest pain or shortness of breath. She feels well overall.  PMH: Former smoker   ROS see history of present illness  Objective  Physical Exam Filed Vitals:   09/28/15 1058  BP: 130/86  Pulse: 73  Temp: 98.3 F (36.8 C)    Physical Exam  Constitutional: She is well-developed, well-nourished, and in no distress.  HENT:  Head: Normocephalic and atraumatic.  Right Ear: External ear normal.  Left Ear: External ear normal.  Mouth/Throat: Oropharynx is clear and moist. No oropharyngeal exudate.  Normal TMs bilaterally  Eyes: Conjunctivae are normal. Pupils are equal, round, and reactive to light.  Neck: Neck supple.  Cardiovascular: Normal rate, regular rhythm and normal heart sounds.   Pulmonary/Chest: Effort normal and breath sounds normal.  Lymphadenopathy:    She has no cervical adenopathy.  Neurological: She is alert. Gait normal.  Skin: Skin is warm and dry. She is not diaphoretic.     Assessment/Plan: Please see individual problem list.  Palpitations No recurrence. Suspect this was related to the Lipitor. Continue to monitor. Given return  precautions.  Hyperlipidemia Tolerating simvastatin. We will check a liver function tests and direct LDL. She'll continue current dosing.  Dysfunction of left Eustachian tube Suspect the discomfort in her left ear is likely related to eustachian tube dysfunction following recent sinus infection. Her other symptoms have resolved though she notes occasional discomfort in her left ear. Benign exam. Discussed starting on Flonase and Claritin. She'll pick these up over-the-counter. She'll continue to monitor. If not improving in the next 1-2 weeks she'll follow-up in the office. Given return precautions.    Orders Placed This Encounter  Procedures  . Direct LDL  . Comp Met (CMET)    Tommi Rumps, MD [place]

## 2015-09-29 ENCOUNTER — Telehealth: Payer: Self-pay | Admitting: Family Medicine

## 2015-09-29 NOTE — Telephone Encounter (Signed)
LM for patient to return call.

## 2015-09-29 NOTE — Telephone Encounter (Signed)
From our visit yesterday she was not having any sinus congestion or pressure and her ear appeared normal. There was no indication for antibiotics at that time. She should be using flonase and taking claritin as discussed and needs to use those for at least 3-4 days to see if there will be a benefit.

## 2015-09-29 NOTE — Telephone Encounter (Signed)
Advised patient of this when I spoke with her

## 2015-09-29 NOTE — Telephone Encounter (Signed)
If this does not improve I would consider referral to ENT for further evaluation.

## 2015-09-29 NOTE — Telephone Encounter (Signed)
Spoke with patient and explained message from Dr. Birdie SonsSonnenberg. Patient stated that when the pain in the ear hits it is a sharp pain and if causes her to feel nauseated. It is not a constant pain. She has started doing the Flonase and Claritin.

## 2015-09-29 NOTE — Telephone Encounter (Signed)
Pt called returning your call. Thank you! °

## 2015-09-29 NOTE — Telephone Encounter (Signed)
Pt states that she is still having sever ear pain with the tube...pt is wanting to see if she can get an antibiotic.Marland Kitchen. Please advise.. Pt was just seen yesterday

## 2015-09-29 NOTE — Telephone Encounter (Signed)
Please advise 

## 2015-10-09 ENCOUNTER — Encounter: Payer: Self-pay | Admitting: Surgical

## 2015-12-27 ENCOUNTER — Ambulatory Visit (INDEPENDENT_AMBULATORY_CARE_PROVIDER_SITE_OTHER): Payer: Medicare Other | Admitting: Family Medicine

## 2015-12-27 ENCOUNTER — Other Ambulatory Visit: Payer: Self-pay | Admitting: Family Medicine

## 2015-12-27 ENCOUNTER — Encounter: Payer: Self-pay | Admitting: Family Medicine

## 2015-12-27 VITALS — BP 128/88 | HR 73 | Temp 98.2°F | Ht 69.5 in | Wt 121.6 lb

## 2015-12-27 DIAGNOSIS — H6982 Other specified disorders of Eustachian tube, left ear: Secondary | ICD-10-CM | POA: Diagnosis not present

## 2015-12-27 DIAGNOSIS — E785 Hyperlipidemia, unspecified: Secondary | ICD-10-CM | POA: Diagnosis not present

## 2015-12-27 DIAGNOSIS — I10 Essential (primary) hypertension: Secondary | ICD-10-CM | POA: Diagnosis not present

## 2015-12-27 DIAGNOSIS — H6992 Unspecified Eustachian tube disorder, left ear: Secondary | ICD-10-CM

## 2015-12-27 NOTE — Assessment & Plan Note (Signed)
At goal. Continue HCTZ.  

## 2015-12-27 NOTE — Progress Notes (Signed)
Pre visit review using our clinic review tool, if applicable. No additional management support is needed unless otherwise documented below in the visit note. 

## 2015-12-27 NOTE — Progress Notes (Signed)
  Marikay AlarEric Capri Raben, MD Phone: [phone]  Sydney Day is a 70 y.o. female who presents today for f/u.  HYPERTENSION Disease Monitoring Home BP Monitoring checks infrequently, reports systolics mostly in the 130s Chest pain- no    Dyspnea- no Medications Compliance-  taking HCTZ.  Edema- no  HYPERLIPIDEMIA Symptoms Chest pain on exertion:  No   Leg claudication:   No Medications: Compliance- taking simvastatin Right upper quadrant pain- no  Muscle aches- no Works outside fair amount for exercise. Trying to watch what she eats somewhat. Doing more smoothies at home.  Patient notes her ears finally cleared up. No ear fullness now. She does note some difficulty hearing mostly out of her left ear. Worked in a [employer] for 40 years. Notes occasional ringing in her ears. No vertigo. Does not want further evaluation of this at this time.   PMH: Former smoker   ROS see history of present illness  Objective  Physical Exam Filed Vitals:   12/27/15 1053  BP: 128/88  Pulse: 73  Temp: 98.2 F (36.8 C)    BP Readings from Last 3 Encounters:  12/27/15 128/88  09/28/15 130/86  08/28/15 126/84   Wt Readings from Last 3 Encounters:  12/27/15 121 lb 9.6 oz (55.157 kg)  09/28/15 122 lb (55.339 kg)  08/28/15 120 lb 12.8 oz (54.795 kg)    Physical Exam  Constitutional: She is well-developed, well-nourished, and in no distress.  HENT:  Head: Normocephalic and atraumatic.  Right Ear: External ear normal.  Left Ear: External ear normal.  Bilateral TMs normal, hearing intact to finger rub bilaterally though slightly decreased left side  Cardiovascular: Normal rate, regular rhythm and normal heart sounds.   Pulmonary/Chest: Effort normal and breath sounds normal.  Neurological: She is alert. Gait normal.  Skin: Skin is warm and dry. She is not diaphoretic.     Assessment/Plan: Please see individual problem list.  Essential hypertension At goal. Continue  HCTZ.  Hyperlipidemia Tolerating simvastatin. Discussed her LDL was unchanged at her last visit. Provided with options of adding Zetia versus working on diet and exercise. Patient opted for diet and exercise. She has cholesterol diet information on this previously sent to her and she will look at this. Encouraged smoothies with vegetables at home.  Dysfunction of left Eustachian tube Fullness is resolved. Does note some mild decreased hearing in the left ear that has been chronic. Benign ear exam. Offered referral for evaluation though she deferred at this time. She will continue to monitor.    Marikay AlarEric Ivalee Strauser, MD [facility] [place]

## 2015-12-27 NOTE — Assessment & Plan Note (Signed)
Fullness is resolved. Does note some mild decreased hearing in the left ear that has been chronic. Benign ear exam. Offered referral for evaluation though she deferred at this time. She will continue to monitor.

## 2015-12-27 NOTE — Assessment & Plan Note (Signed)
Tolerating simvastatin. Discussed her LDL was unchanged at her last visit. Provided with options of adding Zetia versus working on diet and exercise. Patient opted for diet and exercise. She has cholesterol diet information on this previously sent to her and she will look at this. Encouraged smoothies with vegetables at home.

## 2015-12-27 NOTE — Patient Instructions (Signed)
Nice to see you. You're doing quite well. Please work on diet and exercise as we discussed. Please look cholesterol diet that she have at home and make some changes. Please monitor your urine. If you develop chest pain, shortness of breath, worsening hearing, ringing in ears, dizziness, or any new or changing symptoms please seek medical attention.

## 2016-03-28 ENCOUNTER — Ambulatory Visit (INDEPENDENT_AMBULATORY_CARE_PROVIDER_SITE_OTHER): Payer: Medicare Other | Admitting: Family Medicine

## 2016-03-28 ENCOUNTER — Encounter: Payer: Self-pay | Admitting: Family Medicine

## 2016-03-28 ENCOUNTER — Other Ambulatory Visit: Payer: Self-pay | Admitting: Family Medicine

## 2016-03-28 VITALS — BP 126/78 | HR 71 | Temp 98.1°F | Ht <= 58 in | Wt 119.6 lb

## 2016-03-28 DIAGNOSIS — I10 Essential (primary) hypertension: Secondary | ICD-10-CM

## 2016-03-28 DIAGNOSIS — E785 Hyperlipidemia, unspecified: Secondary | ICD-10-CM | POA: Diagnosis not present

## 2016-03-28 LAB — COMPREHENSIVE METABOLIC PANEL
ALK PHOS: 87 U/L (ref 39–117)
ALT: 18 U/L (ref 0–35)
AST: 27 U/L (ref 0–37)
Albumin: 4.5 g/dL (ref 3.5–5.2)
BILIRUBIN TOTAL: 0.6 mg/dL (ref 0.2–1.2)
BUN: 13 mg/dL (ref 6–23)
CO2: 33 meq/L — AB (ref 19–32)
CREATININE: 0.68 mg/dL (ref 0.40–1.20)
Calcium: 9.8 mg/dL (ref 8.4–10.5)
Chloride: 101 mEq/L (ref 96–112)
GFR: 90.98 mL/min (ref >60.00)
GLUCOSE: 102 mg/dL — AB (ref 70–99)
Potassium: 3.9 mEq/L (ref 3.5–5.1)
SODIUM: 141 meq/L (ref 135–145)
TOTAL PROTEIN: 7.6 g/dL (ref 6.0–8.3)

## 2016-03-28 LAB — LDL CHOLESTEROL, DIRECT: Direct LDL: 160 mg/dL

## 2016-03-28 MED ORDER — SIMVASTATIN 40 MG PO TABS: 40.0000 mg | ORAL_TABLET | Freq: Every day | ORAL | 1 refills | Status: DC

## 2016-03-28 NOTE — Progress Notes (Signed)
  Tommi Rumps, MD Phone: [phone]  Sydney Day is a 70 y.o. female who presents today for f/u.  HYPERLIPIDEMIA Symptoms Chest pain on exertion:  no   Leg claudication:   no Medications: Compliance- taking simvastatin Right upper quadrant pain- no  Muscle aches- no She has changed her diet by staying away from fried foods. Also started doing shake once a day. Exercises by walking her dogs. [facility]  HYPERTENSION  Disease Monitoring  Home BP Monitoring [id number] systolic Chest pain- no    Dyspnea- no Medications  Compliance-  Taking HCTZ.  Edema- no  PMH: former smoker   ROS see history of present illness  Objective  Physical Exam Vitals:   03/28/16 1004  BP: 126/78  Pulse: 71  Temp: 98.1 F (36.7 C)    BP Readings from Last 3 Encounters:  03/28/16 126/78  12/27/15 128/88  09/28/15 130/86   Wt Readings from Last 3 Encounters:  03/28/16 119 lb 9.6 oz (54.3 kg)  12/27/15 121 lb 9.6 oz (55.2 kg)  09/28/15 122 lb (55.3 kg)    Physical Exam  Constitutional: No distress.  Cardiovascular: Normal rate, regular rhythm and normal heart sounds.   Pulmonary/Chest: Effort normal and breath sounds normal.  Neurological: She is alert. Gait normal.  Skin: Skin is warm and dry. She is not diaphoretic.     Assessment/Plan: Please see individual problem list.  Essential hypertension At goal. Continue current medications.  Hyperlipidemia Tolerating simvastatin. Check direct LDL and CMP. Continue diet and exercise.   Orders Placed This Encounter  Procedures  . Comp Met (CMET)  . Direct LDL    No orders of the defined types were placed in this encounter.   # Healthcare maintenance: Discussed rest cancer screening with the patient. She declined mammogram and breast exam. She is willing to do a cologuard. Other health maintenance brought up-to-date as has been completed. She defers tetanus vaccination today.   Tommi Rumps, MD [place]

## 2016-03-28 NOTE — Progress Notes (Signed)
Pre visit review using our clinic review tool, if applicable. No additional management support is needed unless otherwise documented below in the visit note. 

## 2016-03-28 NOTE — Assessment & Plan Note (Signed)
At goal. Continue current medications. 

## 2016-03-28 NOTE — Patient Instructions (Signed)
Nice to see you. Please continue to monitor your blood pressure. Please continue to take your medications. Please continue to work on diet and exercise. We will have you complete the cologuard. You need to call your insurance company to ensure that this is covered. If it is not do not provide a sample and please send it back to the company.

## 2016-03-28 NOTE — Assessment & Plan Note (Signed)
Tolerating simvastatin. Check direct LDL and CMP. Continue diet and exercise.

## 2016-04-04 DIAGNOSIS — H2513 Age-related nuclear cataract, bilateral: Secondary | ICD-10-CM | POA: Diagnosis not present

## 2016-06-21 DIAGNOSIS — H2513 Age-related nuclear cataract, bilateral: Secondary | ICD-10-CM | POA: Diagnosis not present

## 2016-07-02 DIAGNOSIS — H35372 Puckering of macula, left eye: Secondary | ICD-10-CM | POA: Diagnosis not present

## 2016-07-09 DIAGNOSIS — H2513 Age-related nuclear cataract, bilateral: Secondary | ICD-10-CM | POA: Diagnosis not present

## 2016-07-11 ENCOUNTER — Encounter: Payer: Self-pay | Admitting: *Deleted

## 2016-07-16 NOTE — H&P (Signed)
See scanned note.

## 2016-07-17 ENCOUNTER — Ambulatory Visit: Payer: Medicare Other | Admitting: Anesthesiology

## 2016-07-17 ENCOUNTER — Encounter
Admission: RE | Disposition: A | Discharge status: Home / Self Care | Payer: Self-pay | Source: Ambulatory Visit | Attending: Ophthalmology

## 2016-07-17 ENCOUNTER — Ambulatory Visit
Admission: RE | Admit: 2016-07-17 | Discharge: 2016-07-17 | Disposition: A | Discharge status: Home / Self Care | Payer: Medicare Other | Source: Ambulatory Visit | Attending: Ophthalmology | Admitting: Ophthalmology

## 2016-07-17 ENCOUNTER — Encounter: Payer: Self-pay | Admitting: *Deleted

## 2016-07-17 DIAGNOSIS — Z87891 Personal history of nicotine dependence: Secondary | ICD-10-CM | POA: Diagnosis not present

## 2016-07-17 DIAGNOSIS — I10 Essential (primary) hypertension: Secondary | ICD-10-CM | POA: Diagnosis not present

## 2016-07-17 DIAGNOSIS — K219 Gastro-esophageal reflux disease without esophagitis: Secondary | ICD-10-CM | POA: Insufficient documentation

## 2016-07-17 DIAGNOSIS — E785 Hyperlipidemia, unspecified: Secondary | ICD-10-CM | POA: Diagnosis not present

## 2016-07-17 DIAGNOSIS — H2512 Age-related nuclear cataract, left eye: Secondary | ICD-10-CM | POA: Diagnosis not present

## 2016-07-17 DIAGNOSIS — H2513 Age-related nuclear cataract, bilateral: Secondary | ICD-10-CM | POA: Diagnosis not present

## 2016-07-17 HISTORY — DX: Palpitations: R00.2

## 2016-07-17 HISTORY — DX: Cardiac arrhythmia, unspecified: I49.9

## 2016-07-17 HISTORY — DX: Gastro-esophageal reflux disease without esophagitis: K21.9

## 2016-07-17 HISTORY — DX: Unspecified hearing loss, unspecified ear: H91.90

## 2016-07-17 HISTORY — PX: CATARACT EXTRACTION W/PHACO: SHX586

## 2016-07-17 SURGERY — PHACOEMULSIFICATION, CATARACT, WITH IOL INSERTION
Anesthesia: Monitor Anesthesia Care | Site: Eye | Laterality: Left | Wound class: Clean

## 2016-07-17 MED ORDER — SODIUM CHLORIDE 0.9 % IV SOLN
INTRAVENOUS | Status: DC
  Administered 2016-07-17: 07:00:00 via INTRAVENOUS

## 2016-07-17 MED ORDER — PHENYLEPHRINE HCL 10 % OP SOLN
1.0000 [drp] | OPHTHALMIC | Status: AC
  Administered 2016-07-17 (×3): 1 [drp] via OPHTHALMIC

## 2016-07-17 MED ORDER — CYCLOPENTOLATE HCL 2 % OP SOLN
OPHTHALMIC | Status: AC
Start: 2016-07-17 — End: 2016-07-17
  Administered 2016-07-17: 08:00:00
  Filled 2016-07-17: qty 2

## 2016-07-17 MED ORDER — LIDOCAINE HCL (PF) 4 % IJ SOLN
INTRAMUSCULAR | Status: DC | PRN
  Administered 2016-07-17: 4 mL via OPHTHALMIC

## 2016-07-17 MED ORDER — POVIDONE-IODINE 5 % OP SOLN
OPHTHALMIC | Status: AC
  Filled 2016-07-17: qty 30

## 2016-07-17 MED ORDER — NA CHONDROIT SULF-NA HYALURON 40-17 MG/ML IO SOLN
INTRAOCULAR | Status: DC | PRN
  Administered 2016-07-17: 1 mL via INTRAOCULAR

## 2016-07-17 MED ORDER — TETRACAINE HCL 0.5 % OP SOLN
OPHTHALMIC | Status: AC
  Filled 2016-07-17: qty 2

## 2016-07-17 MED ORDER — MOXIFLOXACIN HCL 0.5 % OP SOLN
OPHTHALMIC | Status: DC | PRN
  Administered 2016-07-17: 1 [drp] via OPHTHALMIC

## 2016-07-17 MED ORDER — ALFENTANIL 500 MCG/ML IJ INJ
INJECTION | INTRAMUSCULAR | Status: DC | PRN
  Administered 2016-07-17: 500 ug via INTRAVENOUS

## 2016-07-17 MED ORDER — MOXIFLOXACIN HCL 0.5 % OP SOLN
OPHTHALMIC | Status: AC
  Filled 2016-07-17: qty 3

## 2016-07-17 MED ORDER — MIDAZOLAM HCL 2 MG/2ML IJ SOLN
INTRAMUSCULAR | Status: DC | PRN
  Administered 2016-07-17 (×2): .5 mg via INTRAVENOUS

## 2016-07-17 MED ORDER — PHENYLEPHRINE HCL 10 % OP SOLN
OPHTHALMIC | Status: AC
Start: 2016-07-17 — End: 2016-07-17
  Administered 2016-07-17: 08:00:00
  Filled 2016-07-17: qty 5

## 2016-07-17 MED ORDER — CARBACHOL 0.01 % IO SOLN
INTRAOCULAR | Status: DC | PRN
  Administered 2016-07-17: .5 mL via INTRAOCULAR

## 2016-07-17 MED ORDER — NA CHONDROIT SULF-NA HYALURON 40-17 MG/ML IO SOLN
INTRAOCULAR | Status: AC
  Filled 2016-07-17: qty 1

## 2016-07-17 MED ORDER — LIDOCAINE HCL (PF) 4 % IJ SOLN
INTRAMUSCULAR | Status: AC
  Filled 2016-07-17: qty 5

## 2016-07-17 MED ORDER — HYALURONIDASE HUMAN 150 UNIT/ML IJ SOLN
INTRAMUSCULAR | Status: AC
  Filled 2016-07-17: qty 1

## 2016-07-17 MED ORDER — BUPIVACAINE HCL (PF) 0.75 % IJ SOLN
INTRAMUSCULAR | Status: AC
  Filled 2016-07-17: qty 10

## 2016-07-17 MED ORDER — POVIDONE-IODINE 5 % OP SOLN
OPHTHALMIC | Status: DC | PRN
  Administered 2016-07-17: 1 via OPHTHALMIC

## 2016-07-17 MED ORDER — MIDAZOLAM HCL 2 MG/2ML IJ SOLN
INTRAMUSCULAR | Status: AC
  Filled 2016-07-17: qty 2

## 2016-07-17 MED ORDER — TETRACAINE HCL 0.5 % OP SOLN
OPHTHALMIC | Status: DC | PRN
  Administered 2016-07-17: 1 [drp] via OPHTHALMIC

## 2016-07-17 MED ORDER — EPINEPHRINE PF 1 MG/ML IJ SOLN
INTRAMUSCULAR | Status: AC
  Filled 2016-07-17: qty 1

## 2016-07-17 MED ORDER — EPINEPHRINE PF 1 MG/ML IJ SOLN
INTRAMUSCULAR | Status: DC | PRN
  Administered 2016-07-17: 1 mL via OPHTHALMIC

## 2016-07-17 MED ORDER — CEFUROXIME OPHTHALMIC INJECTION 1 MG/0.1 ML
INJECTION | OPHTHALMIC | Status: DC | PRN
  Administered 2016-07-17: .1 mL via INTRACAMERAL

## 2016-07-17 MED ORDER — CYCLOPENTOLATE HCL 2 % OP SOLN
1.0000 [drp] | OPHTHALMIC | Status: AC
  Administered 2016-07-17 (×3): 1 [drp] via OPHTHALMIC

## 2016-07-17 MED ORDER — MOXIFLOXACIN HCL 0.5 % OP SOLN
1.0000 [drp] | OPHTHALMIC | Status: DC
  Administered 2016-07-17 (×3): 1 [drp] via OPHTHALMIC

## 2016-07-17 MED ORDER — LIDOCAINE HCL (PF) 4 % IJ SOLN
INTRAOCULAR | Status: DC | PRN
  Administered 2016-07-17: 2.25 mL via OPHTHALMIC

## 2016-07-17 MED ORDER — CEFUROXIME OPHTHALMIC INJECTION 1 MG/0.1 ML
INJECTION | OPHTHALMIC | Status: AC
  Filled 2016-07-17: qty 0.1

## 2016-07-17 SURGICAL SUPPLY — 30 items
CANNULA ANT/CHMB 27GA (MISCELLANEOUS) ×3 IMPLANT
CORD BIP STRL DISP 12FT (MISCELLANEOUS) ×3 IMPLANT
CUP MEDICINE 2OZ PLAST GRAD ST (MISCELLANEOUS) ×3 IMPLANT
DRAPE XRAY CASSETTE 23X24 (DRAPES) ×3 IMPLANT
ERASER HMR WETFIELD 18G (MISCELLANEOUS) ×3 IMPLANT
GLOVE BIO SURGEON STRL SZ8 (GLOVE) ×3 IMPLANT
GLOVE SURG LX 6.5 MICRO (GLOVE) ×2
GLOVE SURG LX 8.0 MICRO (GLOVE) ×2
GLOVE SURG LX STRL 6.5 MICRO (GLOVE) ×1 IMPLANT
GLOVE SURG LX STRL 8.0 MICRO (GLOVE) ×1 IMPLANT
GOWN STRL REUS W/ TWL LRG LVL3 (GOWN DISPOSABLE) ×1 IMPLANT
GOWN STRL REUS W/ TWL XL LVL3 (GOWN DISPOSABLE) ×1 IMPLANT
GOWN STRL REUS W/TWL LRG LVL3 (GOWN DISPOSABLE) ×2
GOWN STRL REUS W/TWL XL LVL3 (GOWN DISPOSABLE) ×2
LENS IOL ACRSF IQ ULTRA 23.5 (Intraocular Lens) ×1 IMPLANT
LENS IOL ACRYSOF IQ 23.5 (Intraocular Lens) ×3 IMPLANT
PACK CATARACT (MISCELLANEOUS) ×3 IMPLANT
PACK CATARACT DINGLEDEIN LX (MISCELLANEOUS) ×3 IMPLANT
PACK EYE AFTER SURG (MISCELLANEOUS) ×3 IMPLANT
SHLD EYE VISITEC  UNIV (MISCELLANEOUS) ×3 IMPLANT
SOL BSS BAG (MISCELLANEOUS) ×3
SOL PREP PVP 2OZ (MISCELLANEOUS) ×3
SOLUTION BSS BAG (MISCELLANEOUS) ×1 IMPLANT
SOLUTION PREP PVP 2OZ (MISCELLANEOUS) ×1 IMPLANT
SUT SILK 5-0 (SUTURE) ×3 IMPLANT
SYR 3ML LL SCALE MARK (SYRINGE) ×3 IMPLANT
SYR 5ML LL (SYRINGE) ×3 IMPLANT
SYR TB 1ML 27GX1/2 LL (SYRINGE) ×3 IMPLANT
WATER STERILE IRR 250ML POUR (IV SOLUTION) ×3 IMPLANT
WIPE NON LINTING 3.25X3.25 (MISCELLANEOUS) ×3 IMPLANT

## 2016-07-17 NOTE — Anesthesia Preprocedure Evaluation (Signed)
Anesthesia Evaluation  Patient identified by MRN, date of birth, ID band Patient awake    Reviewed: Allergy & Precautions, H&P , NPO status , Patient's Chart, lab work & pertinent test results, reviewed documented beta blocker date and time   Airway Mallampati: II   Neck ROM: full    Dental  (+) Poor Dentition   Pulmonary neg pulmonary ROS, former smoker,    Pulmonary exam normal        Cardiovascular hypertension, negative cardio ROS Normal cardiovascular exam+ dysrhythmias  Rhythm:regular Rate:Normal     Neuro/Psych negative neurological ROS  negative psych ROS   GI/Hepatic negative GI ROS, Neg liver ROS, GERD  ,  Endo/Other  negative endocrine ROS  Renal/GU negative Renal ROS  negative genitourinary   Musculoskeletal   Abdominal   Peds  Hematology negative hematology ROS (+)   Anesthesia Other Findings Past Medical History: No date: Chickenpox No date: Dysrhythmia No date: GERD (gastroesophageal reflux disease) No date: HOH (hard of hearing) No date: Hyperlipidemia No date: Hypertension No date: Palpitations No date: UTI (lower urinary tract infection) Past Surgical History: No date: CESAREAN SECTION BMI    Body Mass Index:  26.33 kg/m     Reproductive/Obstetrics negative OB ROS                             Anesthesia Physical Anesthesia Plan  ASA: III  Anesthesia Plan: General   Post-op Pain Management:    Induction:   Airway Management Planned:   Additional Equipment:   Intra-op Plan:   Post-operative Plan:   Informed Consent: I have reviewed the patients History and Physical, chart, labs and discussed the procedure including the risks, benefits and alternatives for the proposed anesthesia with the patient or authorized representative who has indicated his/her understanding and acceptance.   Dental Advisory Given  Plan Discussed with: CRNA  Anesthesia Plan  Comments:         Anesthesia Quick Evaluation

## 2016-07-17 NOTE — Anesthesia Post-op Follow-up Note (Cosign Needed)
Anesthesia QCDR form completed.        

## 2016-07-17 NOTE — Interval H&P Note (Signed)
History and Physical Interval Note:  07/17/2016 7:26 AM  Sydney Day  has presented today for surgery, with the diagnosis of cataract  The various methods of treatment have been discussed with the patient and family. After consideration of risks, benefits and other options for treatment, the patient has consented to  Procedure(s): CATARACT EXTRACTION PHACO AND INTRAOCULAR LENS PLACEMENT (IOC) (Left) as a surgical intervention .  The patient's history has been reviewed, patient examined, no change in status, stable for surgery.  I have reviewed the patient's chart and labs.  Questions were answered to the patient's satisfaction.     Akshay Spang

## 2016-07-17 NOTE — Op Note (Signed)
Date of Surgery: 07/17/2016 Date of Dictation: 07/17/2016 9:03 AM Pre-operative Diagnosis:  Nuclear Sclerotic Cataract left Eye Post-operative Diagnosis: same Procedure performed: Extra-capsular Cataract Extraction (ECCE) with placement of a posterior chamber intraocular lens (IOL) left Eye IOL:  Implant Name Type Inv. Item Serial No. Manufacturer Lot No. LRB No. Used  LENS IOL ACRYSOF IQ 23.5 - [id number] Intraocular Lens LENS IOL ACRYSOF IQ 23.5 [id number] ALCON   Left 1   Anesthesia: 2% Lidocaine and 4% Marcaine in a 50/50 mixture with 10 unites/ml of Hylenex given as a peribulbar Anesthesiologist: Anesthesiologist: Yevette EdwardsJames G Adams, MD CRNA: Karoline Caldwelleana Starr, CRNA; Marlana SalvageSandra Jessup, CRNA Complications: none Estimated Blood Loss: less than 1 ml  Description of procedure:  The patient was given anesthesia and sedation via intravenous access. The patient was then prepped and draped in the usual fashion. A 25-gauge needle was bent for initiating the capsulorhexis. A 5-0 silk suture was placed through the conjunctiva superior and inferiorly to serve as bridle sutures. Hemostasis was obtained at the superior limbus using an eraser cautery. A partial thickness groove was made at the anterior surgical limbus with a 64 Beaver blade and this was dissected anteriorly with an SYSCOlcon Crescent knife. The anterior chamber was entered at 10 o'clock with a 1.0 mm paracentesis knife and through the lamellar dissection with a 2.6 mm Alcon keratome. Epi-Shugarcaine 0.5 CC [9 cc BSS Plus (Alcon), 3 cc 4% preservative-free lidocaine (Hospira) and 4 cc 1:1000 preservative-free, bisulfite-free epinephrine] was injected into the anterior chamber via the paracentesis tract. Epi-Shugarcaine 0.5 CC [9 cc BSS Plus (Alcon), 3 cc 4% preservative-free lidocaine (Hospira) and 4 cc 1:1000 preservative-free, bisulfite-free epinephrine] was injected into the anterior chamber via the paracentesis tract. DiscoVisc was injected to replace the  aqueous and a continuous tear curvilinear capsulorhexis was performed using a bent 25-gauge needle.  Balance salt on a syringe was used to perform hydro-dissection and phacoemulsification was carried out using a divide and conquer technique. Procedure(s) with comments: CATARACT EXTRACTION PHACO AND INTRAOCULAR LENS PLACEMENT (IOC) (Left) - US 01:18 AP% 24.4 CDE 36.89 Fluid pack lot # [id number]. Irrigation/aspiration was used to remove the residual cortex and the capsular bag was inflated with DiscoVisc. The intraocular lens was inserted into the capsular bag using a pre-loaded UltraSert Delivery System. Irrigation/aspiration was used to remove the residual DiscoVisc. The wound was inflated with balanced salt and checked for leaks. None were found. Miostat was injected via the paracentesis track and 0.1 ml of cefuroxime containing 1 mg of drug  was injected via the paracentesis track. The wound was checked for leaks again and none were found.   The bridal sutures were removed and two drops of Vigamox were placed on the eye. An eye shield was placed to protect the eye and the patient was discharged to the recovery area in good condition.   Cleburn Maiolo MD

## 2016-07-17 NOTE — Anesthesia Procedure Notes (Signed)
Procedure Name: MAC Date/Time: 07/17/2016 8:25 AM Performed by: Hedda Slade Pre-anesthesia Checklist: Patient identified, Emergency Drugs available, Suction available and Patient being monitored Patient Re-evaluated:Patient Re-evaluated prior to inductionOxygen Delivery Method: Nasal cannula

## 2016-07-17 NOTE — Transfer of Care (Signed)
Immediate Anesthesia Transfer of Care Note  Patient: Sydney Day  Procedure(s) Performed: Procedure(s) with comments: CATARACT EXTRACTION PHACO AND INTRAOCULAR LENS PLACEMENT (IOC) (Left) - [place] 01:18 AP% 24.4 CDE 36.89 Fluid pack lot # [id number]  Patient Location: PACU  Anesthesia Type:MAC  Level of Consciousness: awake, alert  and oriented  Airway & Oxygen Therapy: Patient Spontanous Breathing  Post-op Assessment: Report given to RN and Post -op Vital signs reviewed and stable  Post vital signs: Reviewed and stable  Last Vitals:  Vitals:   07/17/16 0655 07/17/16 0906  BP: (!) 152/86 114/90  Pulse: 84 83  Resp: 16 16  Temp: 36.6 C     Last Pain:  Vitals:   07/17/16 0906  TempSrc: Tympanic         Complications: No apparent anesthesia complications

## 2016-07-17 NOTE — Anesthesia Postprocedure Evaluation (Signed)
Anesthesia Post Note  Patient: Sydney Day  Procedure(s) Performed: Procedure(s) (LRB): CATARACT EXTRACTION PHACO AND INTRAOCULAR LENS PLACEMENT (IOC) (Left)  Patient location during evaluation: PACU Anesthesia Type: MAC Level of consciousness: awake and alert Pain management: pain level controlled Vital Signs Assessment: post-procedure vital signs reviewed and stable Respiratory status: nonlabored ventilation and spontaneous breathing Cardiovascular status: stable and blood pressure returned to baseline Postop Assessment: no headache, no backache and no signs of nausea or vomiting Anesthetic complications: no     Last Vitals:  Vitals:   07/17/16 0903 07/17/16 0906  BP: 114/90 114/90  Pulse: 81 83  Resp: (!) 1 16  Temp: 36.3 C     Last Pain:  Vitals:   07/17/16 0906  TempSrc: Tympanic                 Darlyne Russian

## 2016-07-17 NOTE — Discharge Instructions (Addendum)
Eye Surgery Discharge Instructions  Expect mild scratchy sensation or mild soreness. DO NOT RUB YOUR EYE!  The day of surgery:  Minimal physical activity, but bed rest is not required  No reading, computer work, or close hand work  No bending, lifting, or straining.  May watch TV  For 24 hours:  No driving, legal decisions, or alcoholic beverages  Safety precautions  Eat anything you prefer: It is better to start with liquids, then soup then solid foods.  _____ Eye patch should be worn until postoperative exam tomorrow.  ____ Solar shield eyeglasses should be worn for comfort in the sunlight/patch while sleeping  Resume all regular medications including aspirin or Coumadin if these were discontinued prior to surgery. You may shower, bathe, shave, or wash your hair. Tylenol may be taken for mild discomfort.  Call your doctor if you experience significant pain, nausea, or vomiting, fever > 101 or other signs of infection. [phone] or [phone] Specific instructions:  Follow-up Information    DINGELDEIN,STEVEN, MD Follow up.   Specialty:  Ophthalmology Why:  07/18/16 at 9 AM Contact information: [address]   [place] [address] [phone]          Eye Surgery Discharge Instructions  Expect mild scratchy sensation or mild soreness. DO NOT RUB YOUR EYE!  The day of surgery:  Minimal physical activity, but bed rest is not required  No reading, computer work, or close hand work  No bending, lifting, or straining.  May watch TV  For 24 hours:  No driving, legal decisions, or alcoholic beverages  Safety precautions  Eat anything you prefer: It is better to start with liquids, then soup then solid foods.  _____ Eye patch should be worn until postoperative exam tomorrow.  ____ Solar shield eyeglasses should be worn for comfort in the sunlight/patch while sleeping  Resume all regular medications including aspirin or Coumadin if these were  discontinued prior to surgery. You may shower, bathe, shave, or wash your hair. Tylenol may be taken for mild discomfort.  Call your doctor if you experience significant pain, nausea, or vomiting, fever > 101 or other signs of infection. [phone] or [phone] Specific instructions:  Follow-up Information    DINGELDEIN,STEVEN, MD Follow up.   Specialty:  Ophthalmology Why:  07/18/16 at 9 AM Contact information: [address]   [place] [address] [phone]         See handout.

## 2016-09-26 ENCOUNTER — Ambulatory Visit (INDEPENDENT_AMBULATORY_CARE_PROVIDER_SITE_OTHER): Payer: Medicare Other | Admitting: Family Medicine

## 2016-09-26 ENCOUNTER — Encounter: Payer: Self-pay | Admitting: Family Medicine

## 2016-09-26 VITALS — BP 130/90 | HR 75 | Temp 97.8°F | Wt 124.4 lb

## 2016-09-26 DIAGNOSIS — E663 Overweight: Secondary | ICD-10-CM | POA: Insufficient documentation

## 2016-09-26 DIAGNOSIS — R079 Chest pain, unspecified: Secondary | ICD-10-CM

## 2016-09-26 DIAGNOSIS — Z636 Dependent relative needing care at home: Secondary | ICD-10-CM | POA: Insufficient documentation

## 2016-09-26 DIAGNOSIS — I10 Essential (primary) hypertension: Secondary | ICD-10-CM | POA: Diagnosis not present

## 2016-09-26 LAB — COMPREHENSIVE METABOLIC PANEL
ALBUMIN: 4.3 g/dL (ref 3.5–5.2)
ALK PHOS: 90 U/L (ref 39–117)
ALT: 21 U/L (ref 0–35)
AST: 29 U/L (ref 0–37)
BUN: 9 mg/dL (ref 6–23)
CALCIUM: 9.6 mg/dL (ref 8.4–10.5)
CO2: 30 mEq/L (ref 19–32)
Chloride: 104 mEq/L (ref 96–112)
Creatinine, Ser: 0.64 mg/dL (ref 0.40–1.20)
GFR: 97.43 mL/min (ref >60.00)
Glucose, Bld: 96 mg/dL (ref 70–99)
POTASSIUM: 3.8 meq/L (ref 3.5–5.1)
SODIUM: 140 meq/L (ref 135–145)
TOTAL PROTEIN: 7.2 g/dL (ref 6.0–8.3)
Total Bilirubin: 0.5 mg/dL (ref 0.2–1.2)

## 2016-09-26 LAB — LIPID PANEL
CHOL/HDL RATIO: 3
CHOLESTEROL: 250 mg/dL — AB (ref 0–200)
HDL: 78.9 mg/dL (ref >39.00)
LDL CALC: 154 mg/dL — AB (ref 0–99)
NonHDL: 171.11
Triglycerides: 88 mg/dL (ref 0.0–149.0)
VLDL: 17.6 mg/dL (ref 0.0–40.0)

## 2016-09-26 LAB — HEMOGLOBIN A1C: HEMOGLOBIN A1C: 5.9 % (ref 4.6–6.5)

## 2016-09-26 MED ORDER — PNEUMOCOCCAL VAC POLYVALENT 25 MCG/0.5ML IJ INJ: 0.5000 mL | INJECTION | Freq: Once | INTRAMUSCULAR | 0 refills | Status: AC

## 2016-09-26 NOTE — Assessment & Plan Note (Addendum)
Current medications. At goal for age.

## 2016-09-26 NOTE — Assessment & Plan Note (Signed)
Single episode of chest pain about a week ago. Location, shortness of breath, and description as tight are typical though also with atypical components. Could've been a panic attack. Could be cardiac related given her comorbidities and family history. Unlikely VTE related or musculoskeletal. EKG was performed today. We'll refer back to cardiology. If negative workup through them could consider treatment for panic attacks or anxiety.

## 2016-09-26 NOTE — Assessment & Plan Note (Signed)
Advised on dietary changes. Wait on exercise until she sees cardiology.

## 2016-09-26 NOTE — Assessment & Plan Note (Signed)
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 Notes there is some stress related to this and other issues within her family. No depression. Does note some sadness. She notes she feels as though she'll be able to handle this on her own. Offered support. She'll let us know if she needs any assistance.

## 2016-09-26 NOTE — Progress Notes (Signed)
Tommi Rumps, MD Phone: [phone]  Sydney Day is a 71 y.o. female who presents today for f/u.  HYPERTENSION  Disease Monitoring  Home BP Monitoring not checking Chest pain- yes, see below    Dyspnea- yes, see below Medications  Compliance-  Taking HCTZ.  Edema- no  Notes a little over a week ago she was driving in the car with her husband from the [facility] and felt tightness in her chest. Then felt a sharpness. Lasted for about 15 minutes. Her husband reported her color changed. She did not get diaphoretic. She had minimal shortness of breath with this. No radiation. No exertional component. She was just sitting in the car. No recent trips. No recent surgeries. No history of blood clot. She felt as it was likely a panic attack. Does report a family history of heart disease with MI in her father and grandfather. She reports evaluation with cardiology previously with negative workup many years ago.  She's been dealing with a lot as she is the primary caregiver for her husband who is completely disabled. Her granddaughter also has chronic kidney disease and one of her children is dealing with issues with their dog. She notes no depression. She does feel sad about everything they are going through. She prays about this.  Overweight: Patient is not exercising at all now. She tries to eat healthy and by drinking shakes once a day. Also eating more green sputum she does like fried fatty foods.  PMH: Former smoker   ROS see history of present illness  Objective  Physical Exam Vitals:   09/26/16 0920  BP: 130/90  Pulse: 75  Temp: 97.8 F (36.6 C)    BP Readings from Last 3 Encounters:  09/26/16 130/90  07/17/16 136/71  03/28/16 126/78   Wt Readings from Last 3 Encounters:  09/26/16 124 lb 6.4 oz (56.4 kg)  07/17/16 126 lb (57.2 kg)  03/28/16 119 lb 9.6 oz (54.3 kg)    Physical Exam  Constitutional: No distress.  Cardiovascular: Normal rate, regular rhythm and  normal heart sounds.   Pulmonary/Chest: Effort normal and breath sounds normal.  Musculoskeletal: She exhibits no edema.  Neurological: She is alert. Gait normal.  Skin: Skin is warm and dry. She is not diaphoretic.   EKG: Normal sinus rhythm, rate 72, inverted T wave in lead 3 compared to previously otherwise no changes  Assessment/Plan: Please see individual problem list.  Essential hypertension Current medications. At goal for age.  Chest pain Single episode of chest pain about a week ago. Location, shortness of breath, and description as tight are typical though also with atypical components. Could've been a panic attack. Could be cardiac related given her comorbidities and family history. Unlikely VTE related or musculoskeletal. EKG was performed today. We'll refer back to cardiology. If negative workup through them could consider treatment for panic attacks or anxiety.  Overweight (BMI 25.0-29.9) Advised on dietary changes. Wait on exercise until she sees cardiology.  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 Notes there is some stress related to this and other issues within her family. No depression. Does note some sadness. She notes she feels as though she'll be able to handle this on her own. Offered support. She'll let us know if she needs any assistance.   Orders Placed This Encounter  Procedures  . Comp Met (CMET)  . Lipid panel  . HgB A1c  . Ambulatory referral to Cardiology    Referral  Priority:   Routine    Referral Type:   Consultation    Referral Reason:   Specialty Services Required    Requested Specialty:   Cardiology    Number of Visits Requested:   1  . EKG 12-Lead    Meds ordered this encounter  Medications  . pneumococcal 23 valent vaccine (PNU-IMMUNE) 25 MCG/0.5ML injection    Sig: Inject 0.5 mLs into the muscle once.    Dispense:  0.5 mL    Refill:  0    Tommi Rumps, MD [place]

## 2016-09-26 NOTE — Patient Instructions (Signed)
Nice to see you. We'll get you to cardiology given your symptoms and her family history. We'll check lab work and contact you with results. Please go get the Pneumovax at her pharmacy. If you develop chest pain, shortness of breath, or any new or changing symptoms please seek medical attention immediately.

## 2016-09-26 NOTE — Progress Notes (Signed)
Pre visit review using our clinic review tool, if applicable. No additional management support is needed unless otherwise documented below in the visit note. 

## 2016-09-30 ENCOUNTER — Telehealth: Payer: Self-pay | Admitting: Family Medicine

## 2016-09-30 NOTE — Telephone Encounter (Signed)
Quality Metric Gaps :   Patient recently seen in office  With 3 open quality metric gaps .  1. Patient needs Pneumo -vax  PPSV23 ok to schedule nurse visit to receive?  2. Patient cologuard order I have talked with cologuard and all patient needs to do is to complete kit and send back to Cologuard patient is aware.  3 . Patient needs Mammogram ok to order I see it is postponed in chart did patient refuse?    

## 2016-09-30 NOTE — Telephone Encounter (Signed)
Patient refused mammogram. It is ok to get her scheduled for pneumovax as long as insurance will cover it in our office. We discuss cologuard in the office.

## 2016-10-03 NOTE — Telephone Encounter (Signed)
Patient chose pharmacy for Pneumo-vax.

## 2016-10-04 ENCOUNTER — Other Ambulatory Visit: Payer: Self-pay | Admitting: Family Medicine

## 2016-10-04 MED ORDER — ROSUVASTATIN CALCIUM 20 MG PO TABS: 20.0000 mg | ORAL_TABLET | Freq: Every day | ORAL | 3 refills | Status: DC

## 2016-11-08 ENCOUNTER — Ambulatory Visit: Payer: Medicare Other | Admitting: Cardiovascular Disease

## 2016-11-12 ENCOUNTER — Telehealth: Payer: Self-pay | Admitting: Family Medicine

## 2016-11-12 NOTE — Telephone Encounter (Signed)
Spoke to pt to get AWV scheduled. She will call back in the next two weeks.

## 2016-11-29 ENCOUNTER — Other Ambulatory Visit: Payer: Self-pay | Admitting: Family Medicine

## 2016-11-29 NOTE — Telephone Encounter (Signed)
Refill sent to pharmacy. Patient was previously referred to cardiology for chest pain. Please see if she ended up seeing cardiology. Thanks.

## 2016-11-29 NOTE — Telephone Encounter (Signed)
Patient has an appointment with cardiology 12/31/16

## 2016-11-29 NOTE — Telephone Encounter (Signed)
Last OV 09/26/16 last filled 07/31/15 90 3rf

## 2016-12-12 NOTE — Telephone Encounter (Signed)
Pt would like me to call her back on Monday 12/16/16

## 2016-12-16 NOTE — Telephone Encounter (Signed)
Scheduled 12/18/16

## 2016-12-18 ENCOUNTER — Ambulatory Visit (INDEPENDENT_AMBULATORY_CARE_PROVIDER_SITE_OTHER): Payer: Medicare Other

## 2016-12-18 VITALS — BP 136/88 | HR 83 | Temp 98.3°F | Resp 12 | Ht <= 58 in | Wt 123.4 lb

## 2016-12-18 DIAGNOSIS — Z23 Encounter for immunization: Secondary | ICD-10-CM

## 2016-12-18 DIAGNOSIS — Z0000 Encounter for general adult medical examination without abnormal findings: Secondary | ICD-10-CM | POA: Diagnosis not present

## 2016-12-18 DIAGNOSIS — E2839 Other primary ovarian failure: Secondary | ICD-10-CM

## 2016-12-18 DIAGNOSIS — Z1159 Encounter for screening for other viral diseases: Secondary | ICD-10-CM

## 2016-12-18 LAB — HEPATITIS C ANTIBODY: HCV AB: NEGATIVE

## 2016-12-18 NOTE — Progress Notes (Signed)
Subjective:   Sydney Day is a 71 y.o. female who presents for an Initial Medicare Annual Wellness Visit.  Review of Systems    No ROS.  Medicare Wellness Visit. Additional risk factors are reflected in the social history.  Cardiac Risk Factors include: advanced age (>12men, >13 women);hypertension     Objective:    Today's Vitals   12/18/16 1047  BP: 136/88  Pulse: 83  Resp: 12  Temp: 98.3 F (36.8 C)  TempSrc: Oral  SpO2: 97%  Weight: 123 lb 6.4 oz (56 kg)  Height: 4' 9.5" (1.461 m)   Body mass index is 26.24 kg/m.   Current Medications (verified) Outpatient Encounter Prescriptions as of 12/18/2016  Medication Sig  . aspirin 81 MG tablet Take 81 mg by mouth daily.  . hydrochlorothiazide (MICROZIDE) 12.5 MG capsule TAKE ONE CAPSULE BY MOUTH EVERY DAY  . rosuvastatin (CRESTOR) 20 MG tablet Take 1 tablet (20 mg total) by mouth daily.   No facility-administered encounter medications on file as of 12/18/2016.     Allergies (verified) Codeine   History: Past Medical History:  Diagnosis Date  . Chickenpox   . Dysrhythmia   . GERD (gastroesophageal reflux disease)   . HOH (hard of hearing)   . Hyperlipidemia   . Hypertension   . Palpitations   . UTI (lower urinary tract infection)    Past Surgical History:  Procedure Laterality Date  . CATARACT EXTRACTION W/PHACO Left 07/17/2016   Procedure: CATARACT EXTRACTION PHACO AND INTRAOCULAR LENS PLACEMENT (IOC);  Surgeon: Sallee Lange, MD;  Location: [facility] ORS;  Service: Ophthalmology;  Laterality: Left;  [place] 01:18 AP% 24.4 CDE 36.89 Fluid pack lot # [id number]  . CESAREAN SECTION     Family History  Problem Relation Age of Onset  . Heart failure Mother   . Hyperlipidemia Mother   . Kidney disease Mother   . Heart attack Father 31  . Heart attack Paternal Grandfather   . Hyperlipidemia Sister   . Arthritis Unknown   . Breast cancer Unknown   . Hypertension Unknown   . Hyperlipidemia Unknown   .  Dementia Maternal Grandfather   . Kidney disease Son    Social History   Occupational History  . Not on file.   Social History Main Topics  . Smoking status: Former Smoker    Packs/day: 1.00    Years: 25.00    Types: Cigarettes  . Smokeless tobacco: Never Used  . Alcohol use No  . Drug use: No  . Sexual activity: No    Tobacco Counseling Counseling given: Not Answered   Activities of Daily Living In your present state of health, do you have any difficulty performing the following activities: 12/18/2016  Hearing? N  Vision? N  Difficulty concentrating or making decisions? N  Walking or climbing stairs? N  Dressing or bathing? N  Doing errands, shopping? N  Preparing Food and eating ? N  Using the Toilet? N  In the past six months, have you accidently leaked urine? Y  Do you have problems with loss of bowel control? N  Managing your Medications? N  Managing your Finances? N  Housekeeping or managing your Housekeeping? N  Some recent data might be hidden    Immunizations and Health Maintenance Immunization History  Administered Date(s) Administered  . Pneumococcal Conjugate-13 07/31/2015  . Pneumococcal Polysaccharide-23 12/18/2016   Health Maintenance Due  Topic Date Due  . Hepatitis C Screening  09-22-1945  . TETANUS/TDAP  06/19/1965  .  COLONOSCOPY  06/19/1996  . DEXA SCAN  06/20/2011    Patient Care Team: Glori Luis, MD as PCP - General (Family Medicine)  Indicate any recent Medical Services you may have received from other than [facility] providers in the past year (date may be approximate).     Assessment:   This is a routine wellness examination for [place]. The goal of the wellness visit is to assist the patient how to close the gaps in care and create a preventative care plan for the patient.   The roster of all physicians providing medical care to patient is listed in the Snapshot section of the chart.  Osteoporosis risk reviewed.     Safety issues reviewed; care giver for spouse.  Smoke and carbon monoxide detectors in the home. No firearms in the home.  Wears seatbelts when driving or riding with others. Patient does wear sunscreen or protective clothing when in direct sunlight. No violence in the home.  Patient is alert, normal appearance, oriented to person/place/and time.  Correctly identified the president of the [place], recall of 3/3 words, and performing simple calculations. Displays appropriate judgement and can read correct time from watch face.   No new identified risk were noted.  No failures at ADL's or IADL's.    BMI- discussed the importance of a healthy diet, water intake and the benefits of aerobic exercise. Educational material provided.   24 hour diet recall: Breakfast: Cereal, raisins Lunch: Deli sandwich Dinner: [employer], vegetables Snack drink: Caramel frappe  Daily fluid intake: 2 cups of caffeine, 8 cups of water  Dental- upper dentures.  Visits Dr. Micheal Likens PRN.  Sleep patterns- Sleeps 7 hours at night.  Wakes feeling rested.  Pneumococcal 23 vaccine administered L deltoid, tolerated well. Educational material provided.  Dexa Scan discussed, ordered; follow as directed.  Educational material provided.  TDAP vaccine deferred per patient preference.  Follow up with insurance.  Educational material provided.  Hepatitis C Screening discussed, lab completed.  Educational material provided.  Colonoscopy discussed, declined.  Cologuard discussed, she is checking with her insurance for coverage.  Educational material provided.  Patient Concerns: None at this time. Follow up with PCP as needed.  Hearing/Vision screen Hearing Screening Comments: Patient is able to hear conversational tones without difficulty.  No issues reported.   Vision Screening Comments: Follow[facility] by [facility] (Dr. Adele Schilder) Wears corrective lenses Last OV 08/2016 Cataract extraction, L Visual acuity not  assessed per patient preference since they have regular follow up with the ophthalmologist  Dietary issues and exercise activities discussed: Current Exercise Habits: Home exercise routine, Type of exercise: walking (Walks the dog 4 days a week.  Activiely works 2 days a week.), Frequency (Times/Week): 4, Intensity: Moderate  Goals    . Increase physical activity          Stay active and continue to walk for exercise       Depression Screen PHQ 2/9 Scores 12/18/2016 09/26/2016  PHQ - 2 Score 0 0    Fall Risk Fall Risk  12/18/2016 09/26/2016  Falls in the past year? No No    Cognitive Function: MMSE - Mini Mental State Exam 12/18/2016  Orientation to time 5  Orientation to Place 5  Registration 3  Attention/ Calculation 5  Recall 3  Language- name 2 objects 2  Language- repeat 1  Language- follow 3 step command 3  Language- read & follow direction 1  Write a sentence 1  Copy design 1  Total score  30        Screening Tests Health Maintenance  Topic Date Due  . Hepatitis C Screening  Mar 03, 1946  . TETANUS/TDAP  06/19/1965  . COLONOSCOPY  06/19/1996  . DEXA SCAN  06/20/2011  . MAMMOGRAM  04/03/2017 (Originally 06/19/1996)  . PNA vac Low Risk Adult (2 of 2 - PPSV23) 10/08/2018 (Originally 07/30/2016)  . INFLUENZA VACCINE  01/08/2017      Plan:   End of life planning; Advanced aging; Advanced directives discussed.  No HCPOA/Living Will.  Additional information provided to help them start the conversation with family.  Copy of HCPOA/Living Will requested upon completion. Time spent on this topic is 25 minutes.  I have personally reviewed and noted the following in the patient's chart:   . Medical and social history . Use of alcohol, tobacco or illicit drugs  . Current medications and supplements . Functional ability and status . Nutritional status . Physical activity . Advanced directives . List of other physicians . Hospitalizations, surgeries, and ER visits in  previous 12 months . Vitals . Screenings to include cognitive, depression, and falls . Referrals and appointments  In addition, I have reviewed and discussed with patient certain preventive protocols, quality metrics, and best practice recommendations. A written personalized care plan for preventive services as well as general preventive health recommendations were provided to patient.     Ashok PallOBrien-Blaney, Kameryn Tisdel L, LPN   [id number]/40987/04/2017

## 2016-12-18 NOTE — Patient Instructions (Addendum)
Sydney Day , Thank you for taking time to come for your Medicare Wellness Visit. I appreciate your ongoing commitment to your health goals. Please review the following plan we discussed and let me know if I can assist you in the future.   Follow up with Dr. Birdie Sons as needed.    Dexa Scan ordered, follow as directed.  Cologuard, follow up with PCP after checking with your insurance.  Bring a copy of your Health Care Power of Attorney and/or Living Will to be scanned into chart once completed.  Have a great day!  These are the goals we discussed: Goals    . Increase physical activity          Stay active and continue to walk for exercise        This is a list of the screening recommended for you and due dates:  Health Maintenance  Topic Date Due  .  Hepatitis C: One time screening is recommended by Center for Disease Control  (CDC) for  adults born from [birth year] through 1964.   17-Feb-1946  . Tetanus Vaccine  06/19/1965  . Colon Cancer Screening  06/19/1996  . DEXA scan (bone density measurement)  06/20/2011  . Mammogram  04/03/2017*  . Pneumonia vaccines (2 of 2 - PPSV23) 10/08/2018*  . Flu Shot  01/08/2017  *Topic was postponed. The date shown is not the original due date.    Bone Densitometry Bone densitometry is an imaging test that uses a special X-ray to measure the amount of calcium and other minerals in your bones (bone density). This test is also known as a bone mineral density test or dual-energy X-ray absorptiometry (DXA). The test can measure bone density at your hip and your spine. It is similar to having a regular X-ray. You may have this test to:  Diagnose a condition that causes weak or thin bones (osteoporosis).  Predict your risk of a broken bone (fracture).  Determine how well osteoporosis treatment is working.  Tell a health care provider about:  Any allergies you have.  All medicines you are taking, including vitamins, herbs, eye drops, creams, and  over-the-counter medicines.  Any problems you or family members have had with anesthetic medicines.  Any blood disorders you have.  Any surgeries you have had.  Any medical conditions you have.  Possibility of pregnancy.  Any other medical test you had within the previous 14 days that used contrast material. What are the risks? Generally, this is a safe procedure. However, problems can occur and may include the following:  This test exposes you to a very small amount of radiation.  The risks of radiation exposure may be greater to unborn children.  What happens before the procedure?  Do not take any calcium supplements for 24 hours before having the test. You can otherwise eat and drink what you usually do.  Take off all metal jewelry, eyeglasses, dental appliances, and any other metal objects. What happens during the procedure?  You may lie on an exam table. There will be an X-ray generator below you and an imaging device above you.  Other devices, such as boxes or braces, may be used to position your body properly for the scan.  You will need to lie still while the machine slowly scans your body.  The images will show up on a computer monitor. What happens after the procedure? You may need more testing at a later time. This information is not intended to replace advice given to  you by your health care provider. Make sure you discuss any questions you have with your health care provider. Document Released: 06/18/2004 Document Revised: 11/02/2015 Document Reviewed: 11/04/2013 Elsevier Interactive Patient Education  2018 [employer]

## 2016-12-27 ENCOUNTER — Ambulatory Visit: Payer: Medicare Other | Admitting: Family Medicine

## 2016-12-31 ENCOUNTER — Ambulatory Visit (INDEPENDENT_AMBULATORY_CARE_PROVIDER_SITE_OTHER): Payer: Medicare Other | Admitting: Cardiovascular Disease

## 2016-12-31 ENCOUNTER — Encounter: Payer: Self-pay | Admitting: Cardiovascular Disease

## 2016-12-31 VITALS — BP 122/82 | HR 86 | Ht <= 58 in | Wt 124.5 lb

## 2016-12-31 DIAGNOSIS — I10 Essential (primary) hypertension: Secondary | ICD-10-CM | POA: Diagnosis not present

## 2016-12-31 DIAGNOSIS — R079 Chest pain, unspecified: Secondary | ICD-10-CM

## 2016-12-31 DIAGNOSIS — E785 Hyperlipidemia, unspecified: Secondary | ICD-10-CM

## 2016-12-31 NOTE — Progress Notes (Signed)
Cardiology Office Note   Date:  12/31/2016   ID:  Sydney Day, Sydney Day [birth year], MRN [id number]  PCP:  Glori Luis, MD  Cardiologist:  Lorine Bears, MD   Chief Complaint  Patient presents with  . other    Ref by Dr. Birdie Sons for chest pain. Pt. was seen by Dr. Kirke Corin 6 years ago. Meds reviewed by the pt. verbally. Pt. c/o chest pain that comes and goes with having 2 spells of chest pain since Feb. 2018.       History of Present Illness: Sydney Day is a 71 y.o. female who was referred by Dr. Birdie Sons for evaluation of chest pain. She has no previous cardiac history. She has known history of hypertension, hyperlipidemia and GERD. She was seen by me in 2013 for atypical chest pain. She underwent GXT which was normal. Her symptoms were felt to be due to GERD at that time. Blood pressure was also uncontrolled which subsequently improved after the addition of small dose hydrochlorothiazide. She had an episode of prolonged chest pain in February while she was driving the car. It was described as substernal burning and tightness feeling with associated sweating and mild shortness of breath. It lasted for about 35 minutes. She continued to drive. She did not seek immediate medical attention at that time. She has been under significant stress given the stability of her husband who is 400 pounds with PTSD. She is the caregiver. Since that time, she had one more similar episode. She complains of exertional dyspnea with no orthopnea, PND or leg edema. She has strong family history of coronary artery disease. She quit smoking many years ago.    Past Medical History:  Diagnosis Date  . Chickenpox   . Dysrhythmia   . GERD (gastroesophageal reflux disease)   . HOH (hard of hearing)   . Hyperlipidemia   . Hypertension   . Palpitations   . UTI (lower urinary tract infection)     Past Surgical History:  Procedure Laterality Date  . CATARACT EXTRACTION W/PHACO Left 07/17/2016    Procedure: CATARACT EXTRACTION PHACO AND INTRAOCULAR LENS PLACEMENT (IOC);  Surgeon: Sallee Lange, MD;  Location: [facility] ORS;  Service: Ophthalmology;  Laterality: Left;  [place] 01:18 AP% 24.4 CDE 36.89 Fluid pack lot # [id number]  . CESAREAN SECTION       Current Outpatient Prescriptions  Medication Sig Dispense Refill  . aspirin 81 MG tablet Take 81 mg by mouth daily.    . hydrochlorothiazide (MICROZIDE) 12.5 MG capsule TAKE ONE CAPSULE BY MOUTH EVERY DAY 90 capsule 3  . rosuvastatin (CRESTOR) 20 MG tablet Take 1 tablet (20 mg total) by mouth daily. 90 tablet 3   No current facility-administered medications for this visit.     Allergies:   Codeine    Social History:  The patient  reports that she has quit smoking. Her smoking use included Cigarettes. She has a 25.00 pack-year smoking history. She has never used smokeless tobacco. She reports that she does not drink alcohol or use drugs.   Family History:  The patient's family history includes Arthritis in her unknown relative; Breast cancer in her unknown relative; Dementia in her maternal grandfather; Heart attack in her paternal grandfather; Heart attack (age of onset: 41) in her father; Heart failure in her mother; Hyperlipidemia in her mother, sister, and unknown relative; Hypertension in her unknown relative; Kidney disease in her mother and son.    ROS:  Please see the history of present  illness.   Otherwise, review of systems are positive for none.   All other systems are reviewed and negative.    PHYSICAL EXAM: VS:  BP 122/82 (BP Location: Right Arm, Patient Position: Sitting, Cuff Size: Normal)   Pulse 86   Ht 4' 9.5" (1.461 m)   Wt 124 lb 8 oz (56.5 kg)   BMI 26.48 kg/m  , BMI Body mass index is 26.48 kg/m. GEN: Well nourished, well developed, in no acute distress  HEENT: normal  Neck: no JVD, carotid bruits, or masses Cardiac: RRR; no rubs, or gallops,no edema . One out of 6 systolic ejection murmur at the  base. Respiratory:  clear to auscultation bilaterally, normal work of breathing GI: soft, nontender, nondistended, + BS MS: no deformity or atrophy  Skin: warm and dry, no rash Neuro:  Strength and sensation are intact Psych: euthymic mood, full affect Distal pulses are normal   EKG:  EKG is ordered today. The ekg ordered today demonstrates normal sinus rhythm with left axis deviation and moderate LVH.   Recent Labs: 09/26/2016: ALT 21; BUN 9; Creatinine, Ser 0.64; Potassium 3.8; Sodium 140    Lipid Panel    Component Value Date/Time   CHOL 250 (H) 09/26/2016 0950   TRIG 88.0 09/26/2016 0950   HDL 78.90 09/26/2016 0950   CHOLHDL 3 09/26/2016 0950   VLDL 17.6 09/26/2016 0950   LDLCALC 154 (H) 09/26/2016 0950   LDLDIRECT 160.0 03/28/2016 1023      Wt Readings from Last 3 Encounters:  12/31/16 124 lb 8 oz (56.5 kg)  12/18/16 123 lb 6.4 oz (56 kg)  09/26/16 124 lb 6.4 oz (56.4 kg)       PAD Screen 12/31/2016  Previous PAD dx? No  Previous surgical procedure? No  Pain with walking? No  Feet/toe relief with dangling? No  Painful, non-healing ulcers? No  Extremities discolored? No      ASSESSMENT AND PLAN:  1.  Chest pain with intermediate risk of coronary artery disease: The quality of symptoms was worrisome for a cardiac etiology. However, this has not been consistent and she had only one more episode. She does have exertional dyspnea. She has risk factors for coronary artery disease including strong family history, age, hypertension, hyperlipidemia and previous tobacco use. Baseline ECG is abnormal with LVH and left axis deviation. I requested a treadmill nuclear stress test for risk stratification.  2. Essential hypertension: Blood pressure is controlled on small dose hydrochlorothiazide.  3. Hyperlipidemia: Her LDL was 154 she was started on rosuvastatin.    Disposition:   FU with me as needed.   Signed,  Lorine BearsMuhammad Kivon Aprea, MD  12/31/2016 3:13 PM    [facility]

## 2016-12-31 NOTE — Patient Instructions (Addendum)
Medication Instructions:  Your physician recommends that you continue on your current medications as directed. Please refer to the Current Medication list given to you today.   Labwork: none  Testing/Procedures: Your physician has requested that you have a lexiscan myoview. For further information please visit [email or web address]. Please follow instruction sheet, as given.  [facility] MYOVIEW  Your caregiver has ordered a Stress Test with nuclear imaging. The purpose of this test is to evaluate the blood supply to your heart muscle. This procedure is referred to as a "Non-Invasive Stress Test." This is because other than having an IV started in your vein, nothing is inserted or "invades" your body. Cardiac stress tests are done to find areas of poor blood flow to the heart by determining the extent of coronary artery disease (CAD). Some patients exercise on a treadmill, which naturally increases the blood flow to your heart, while others who are  unable to walk on a treadmill due to physical limitations have a pharmacologic/chemical stress agent called Lexiscan . This medicine will mimic walking on a treadmill by temporarily increasing your coronary blood flow.   Please note: these test may take anywhere between 2-4 hours to complete  PLEASE REPORT TO [facility] ENTRANCE  THE VOLUNTEERS AT THE FIRST DESK WILL DIRECT YOU WHERE TO GO  Date of Procedure: Monday, July 30 Arrival Time for Procedure:  7:45am  Instructions regarding medication:   _xx___:  Hold other medications as follows: Do not take hydrochlorothiazide the morning of your test.   PLEASE NOTIFY THE OFFICE AT LEAST 24 HOURS IN ADVANCE IF YOU ARE UNABLE TO KEEP YOUR APPOINTMENT.  [phone] AND  PLEASE NOTIFY NUCLEAR MEDICINE AT [facility] AT LEAST 24 HOURS IN ADVANCE IF YOU ARE UNABLE TO KEEP YOUR APPOINTMENT. [phone]  How to prepare for your Myoview test:  1. Do not eat or drink after midnight 2. No caffeine for 24 hours  prior to test 3. No smoking 24 hours prior to test. 4. Your medication may be taken with water.  If your doctor stopped a medication because of this test, do not take that medication. 5. Ladies, please do not wear dresses.  Skirts or pants are appropriate. Please wear a short sleeve shirt. 6. No perfume, cologne or lotion. 7. Wear comfortable walking shoes. No heels!            Follow-Up: Your physician recommends that you schedule a follow-up appointment as needed with Dr. Kirke CorinArida.    Any Other Special Instructions Will Be Listed Below (If Applicable).     If you need a refill on your cardiac medications before your next appointment, please call your pharmacy.  Cardiac Nuclear Scan A cardiac nuclear scan is a test that measures blood flow to the heart when a person is resting and when he or she is exercising. The test looks for problems such as:  Not enough blood reaching a portion of the heart.  The heart muscle not working normally.  You may need this test if:  You have heart disease.  You have had abnormal lab results.  You have had heart surgery or angioplasty.  You have chest pain.  You have shortness of breath.  In this test, a radioactive dye (tracer) is injected into your bloodstream. After the tracer has traveled to your heart, an imaging device is used to measure how much of the tracer is absorbed by or distributed to various areas of your heart. This procedure is usually done at a hospital and  takes 2-4 hours. Tell a health care provider about:  Any allergies you have.  All medicines you are taking, including vitamins, herbs, eye drops, creams, and over-the-counter medicines.  Any problems you or family members have had with the use of anesthetic medicines.  Any blood disorders you have.  Any surgeries you have had.  Any medical conditions you have.  Whether you are pregnant or may be pregnant. What are the risks? Generally, this is a safe  procedure. However, problems may occur, including:  Serious chest pain and heart attack. This is only a risk if the stress portion of the test is done.  Rapid heartbeat.  Sensation of warmth in your chest. This usually passes quickly.  What happens before the procedure?  Ask your health care provider about changing or stopping your regular medicines. This is especially important if you are taking diabetes medicines or blood thinners.  Remove your jewelry on the day of the procedure. What happens during the procedure?  An IV tube will be inserted into one of your veins.  Your health care provider will inject a small amount of radioactive tracer through the tube.  You will wait for 20-40 minutes while the tracer travels through your bloodstream.  Your heart activity will be monitored with an electrocardiogram (ECG).  You will lie down on an exam table.  Images of your heart will be taken for about 15-20 minutes.  You may be asked to exercise on a treadmill or stationary bike. While you exercise, your heart's activity will be monitored with an ECG, and your blood pressure will be checked. If you are unable to exercise, you may be given a medicine to increase blood flow to parts of your heart.  When blood flow to your heart has peaked, a tracer will again be injected through the IV tube.  After 20-40 minutes, you will get back on the exam table and have more images taken of your heart.  When the procedure is over, your IV tube will be removed. The procedure may vary among health care providers and hospitals. Depending on the type of tracer used, scans may need to be repeated 3-4 hours later. What happens after the procedure?  Unless your health care provider tells you otherwise, you may return to your normal schedule, including diet, activities, and medicines.  Unless your health care provider tells you otherwise, you may increase your fluid intake. This will help flush the  contrast dye from your body. Drink enough fluid to keep your urine clear or pale yellow.  It is up to you to get your test results. Ask your health care provider, or the department that is doing the test, when your results will be ready. Summary  A cardiac nuclear scan measures the blood flow to the heart when a person is resting and when he or she is exercising.  You may need this test if you are at risk for heart disease.  Tell your health care provider if you are pregnant.  Unless your health care provider tells you otherwise, increase your fluid intake. This will help flush the contrast dye from your body. Drink enough fluid to keep your urine clear or pale yellow. This information is not intended to replace advice given to you by your health care provider. Make sure you discuss any questions you have with your health care provider. Document Released: 06/21/2004 Document Revised: 05/29/2016 Document Reviewed: 05/05/2013 Elsevier Interactive Patient Education  2017 [employer].

## 2017-01-01 ENCOUNTER — Ambulatory Visit (INDEPENDENT_AMBULATORY_CARE_PROVIDER_SITE_OTHER): Payer: Medicare Other | Admitting: Family Medicine

## 2017-01-01 ENCOUNTER — Encounter: Payer: Self-pay | Admitting: Family Medicine

## 2017-01-01 VITALS — BP 140/80 | HR 72 | Temp 98.4°F | Wt 125.8 lb

## 2017-01-01 DIAGNOSIS — E785 Hyperlipidemia, unspecified: Secondary | ICD-10-CM | POA: Diagnosis not present

## 2017-01-01 DIAGNOSIS — M25561 Pain in right knee: Secondary | ICD-10-CM | POA: Diagnosis not present

## 2017-01-01 DIAGNOSIS — I10 Essential (primary) hypertension: Secondary | ICD-10-CM | POA: Diagnosis not present

## 2017-01-01 DIAGNOSIS — G8929 Other chronic pain: Secondary | ICD-10-CM | POA: Diagnosis not present

## 2017-01-01 DIAGNOSIS — M25562 Pain in left knee: Secondary | ICD-10-CM | POA: Diagnosis not present

## 2017-01-01 DIAGNOSIS — R079 Chest pain, unspecified: Secondary | ICD-10-CM

## 2017-01-01 LAB — HEPATIC FUNCTION PANEL
ALK PHOS: 80 U/L (ref 39–117)
ALT: 31 U/L (ref 0–35)
AST: 32 U/L (ref 0–37)
Albumin: 4.1 g/dL (ref 3.5–5.2)
BILIRUBIN DIRECT: 0.1 mg/dL (ref 0.0–0.3)
TOTAL PROTEIN: 7.3 g/dL (ref 6.0–8.3)
Total Bilirubin: 0.5 mg/dL (ref 0.2–1.2)

## 2017-01-01 LAB — LDL CHOLESTEROL, DIRECT: Direct LDL: 110 mg/dL

## 2017-01-01 NOTE — Assessment & Plan Note (Signed)
Continue Crestor.  Labs as outlined below. 

## 2017-01-01 NOTE — Assessment & Plan Note (Signed)
Suspect patient has arthritis in her knees. Only bothers her a little bit in the morning. She'll monitor. If worsen she'll let us know.

## 2017-01-01 NOTE — Progress Notes (Signed)
  Sydney AlarEric Sonia Stickels, MD Phone: [phone]  Sydney Day is a 71 y.o. female who presents today for f/u.  HYPERTENSION  Disease Monitoring  Home BP Monitoring 122/84 Chest pain- none since last visit Dyspnea- no Medications  Compliance-  Taking HCTZ, though has not taken today  Edema- no  Hyperlipidemia: Taking Crestor. Taking aspirin. No right upper quadrant pain. No myalgias.   Does note occasional joint aches in her knees. Only bothers her in the morning and when she gets moving the do not bother her anymore.  Chest pain: Was seen by cardiology yesterday. Has only had one episode of chest discomfort which was described as a central chest pressure resolved with rest and taking aspirin. They're planning to do a stress test on Monday.   PMH: Former smoker   ROS see history of present illness  Objective  Physical Exam Vitals:   01/01/17 1012  BP: 140/80  Pulse: 72  Temp: 98.4 F (36.9 C)    BP Readings from Last 3 Encounters:  01/01/17 140/80  12/31/16 122/82  12/18/16 136/88   Wt Readings from Last 3 Encounters:  01/01/17 125 lb 12.8 oz (57.1 kg)  12/31/16 124 lb 8 oz (56.5 kg)  12/18/16 123 lb 6.4 oz (56 kg)    Physical Exam  Constitutional: No distress.  Cardiovascular: Normal rate, regular rhythm and normal heart sounds.   Pulmonary/Chest: Effort normal and breath sounds normal.  Musculoskeletal: She exhibits no edema.  Bilateral knees with no tenderness or swelling, no ligament laxity, negative McMurray's  Neurological: She is alert. Gait normal.  Skin: She is not diaphoretic.     Assessment/Plan: Please see individual problem list.  Essential hypertension Well-controlled at home. Continue current medications.  Hyperlipidemia Continue Crestor. Labs as outlined below.   Chest pain No recurrent episodes. Has seen cardiology. She'll have a stress test on Monday as planned.  Bilateral knee pain Suspect patient has arthritis in her knees. Only  bothers her a little bit in the morning. She'll monitor. If worsen she'll let us know.   Orders Placed This Encounter  Procedures  . LDL cholesterol, direct  . Hepatic function panel   Sydney AlarEric Aly Seidenberg, MD [facility]

## 2017-01-01 NOTE — Assessment & Plan Note (Signed)
Well controlled at home. Continue current medications

## 2017-01-01 NOTE — Assessment & Plan Note (Signed)
No recurrent episodes. Has seen cardiology. She'll have a stress test on Monday as planned.

## 2017-01-01 NOTE — Patient Instructions (Signed)
Nice to see you. We will check your cholesterol today. Please proceed with the stress test as planned through cardiology. If you develop persistent chest pain or you develop trouble breathing please be evaluated.

## 2017-01-03 ENCOUNTER — Other Ambulatory Visit: Payer: Self-pay | Admitting: Family Medicine

## 2017-01-03 DIAGNOSIS — E785 Hyperlipidemia, unspecified: Secondary | ICD-10-CM

## 2017-01-20 ENCOUNTER — Ambulatory Visit: Payer: Medicare Other

## 2017-01-22 ENCOUNTER — Inpatient Hospital Stay: Admission: RE | Admit: 2017-01-22 | Payer: Medicare Other | Source: Ambulatory Visit

## 2017-01-31 ENCOUNTER — Telehealth: Payer: Self-pay | Admitting: Family Medicine

## 2017-01-31 NOTE — Telephone Encounter (Signed)
Left message to return call 

## 2017-01-31 NOTE — Telephone Encounter (Signed)
Pt called and stated that she had increased her cholesterol medication as directed. Pt states that she was taking it for about a week but she started to get bloated and have bad joint aches. Pt is taking just 20 mg now instead of 40.

## 2017-01-31 NOTE — Telephone Encounter (Signed)
Noted. Please see if her symptoms improved with decreasing the dose again.

## 2017-01-31 NOTE — Telephone Encounter (Signed)
Please advise 

## 2017-02-04 NOTE — Telephone Encounter (Signed)
Left message to return call 

## 2017-02-04 NOTE — Telephone Encounter (Signed)
Noted. She should continue at the current dose. Thanks.

## 2017-02-04 NOTE — Telephone Encounter (Signed)
Left message to notify

## 2017-02-04 NOTE — Telephone Encounter (Signed)
Patient states her symptoms have improved

## 2017-02-05 ENCOUNTER — Other Ambulatory Visit: Payer: Medicare Other

## 2017-02-11 ENCOUNTER — Ambulatory Visit: Payer: Medicare Other

## 2017-02-12 ENCOUNTER — Telehealth: Payer: Self-pay | Admitting: Cardiovascular Disease

## 2017-02-12 NOTE — Telephone Encounter (Signed)
Patient removed from wq no show cancelled x 3 for nm study '

## 2017-02-12 NOTE — Telephone Encounter (Signed)
S/w pt earlier today. See phone note.

## 2017-02-12 NOTE — Telephone Encounter (Signed)
Pt did not show for Sept 4 myoview at [facility]. I s/w pt today as this is the third time she has missed this test. Other dates:  July 30 and August 13. 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 Advised pt to contact us with any concerns.

## 2017-04-10 DIAGNOSIS — S99912A Unspecified injury of left ankle, initial encounter: Secondary | ICD-10-CM | POA: Diagnosis not present

## 2017-04-10 DIAGNOSIS — M25572 Pain in left ankle and joints of left foot: Secondary | ICD-10-CM | POA: Diagnosis not present

## 2017-04-10 DIAGNOSIS — S93402A Sprain of unspecified ligament of left ankle, initial encounter: Secondary | ICD-10-CM | POA: Diagnosis not present

## 2017-05-12 DIAGNOSIS — H2511 Age-related nuclear cataract, right eye: Secondary | ICD-10-CM | POA: Diagnosis not present

## 2017-05-28 ENCOUNTER — Telehealth: Payer: Medicare Other | Admitting: Nurse Practitioner

## 2017-05-28 DIAGNOSIS — M545 Low back pain: Secondary | ICD-10-CM

## 2017-05-28 NOTE — Progress Notes (Signed)
Based on what you shared with me it looks like you have a serious condition that should be evaluated in a face to face office visit.  NOTE: Even if you have entered your credit card information for this eVisit, you will not be charged.   If you are having a true medical emergency please call 911.  If you need an urgent face to face visit, [place] has four urgent care centers for your convenience.  If you need care fast and have a high deductible or no insurance consider:   [email or web address]  [phone]  [address], [address] [place], [place] [address] 8 am to 8 pm Monday-Friday 10 am to 4 pm Saturday-Sunday   The following sites will take your  insurance:    . [facility]  [phone] Get Driving Directions Find a Provider at this Location  [address] [place], [place] [address] . 10 am to 8 pm Monday-Friday . 12 pm to 8 pm Saturday-Sunday   . [facility]  [phone] Get Driving Directions Find a Provider at this Location  [address], [address] [place], [place] [address] . 8 am to 8 pm Monday-Friday . 9 am to 6 pm Saturday . 11 am to 6 pm Sunday   .  [facility]gent [facility] at [facility] [place]  [phone] Get Driving Directions  [address] [place], [place] [address] . 8 am to 8 pm Monday-Friday . 8 am to 4 pm Saturday-Sunday   Your e-visit answers were reviewed by a board certified advanced clinical practitioner to complete your personal care plan.  Thank you for using e-Visits.  

## 2017-07-04 ENCOUNTER — Encounter: Payer: Self-pay | Admitting: Family Medicine

## 2017-07-04 ENCOUNTER — Other Ambulatory Visit: Payer: Self-pay

## 2017-07-04 ENCOUNTER — Ambulatory Visit (INDEPENDENT_AMBULATORY_CARE_PROVIDER_SITE_OTHER): Payer: Medicare Other | Admitting: Family Medicine

## 2017-07-04 VITALS — BP 120/80 | HR 77 | Temp 98.1°F | Wt 125.0 lb

## 2017-07-04 DIAGNOSIS — I10 Essential (primary) hypertension: Secondary | ICD-10-CM | POA: Diagnosis not present

## 2017-07-04 DIAGNOSIS — Z636 Dependent relative needing care at home: Secondary | ICD-10-CM | POA: Diagnosis not present

## 2017-07-04 DIAGNOSIS — Z780 Asymptomatic menopausal state: Secondary | ICD-10-CM

## 2017-07-04 DIAGNOSIS — E785 Hyperlipidemia, unspecified: Secondary | ICD-10-CM

## 2017-07-04 DIAGNOSIS — E663 Overweight: Secondary | ICD-10-CM | POA: Diagnosis not present

## 2017-07-04 LAB — HEPATIC FUNCTION PANEL
ALBUMIN: 4.2 g/dL (ref 3.5–5.2)
ALT: 17 U/L (ref 0–35)
AST: 23 U/L (ref 0–37)
Alkaline Phosphatase: 81 U/L (ref 39–117)
Bilirubin, Direct: 0.1 mg/dL (ref 0.0–0.3)
TOTAL PROTEIN: 7.2 g/dL (ref 6.0–8.3)
Total Bilirubin: 0.7 mg/dL (ref 0.2–1.2)

## 2017-07-04 LAB — LDL CHOLESTEROL, DIRECT: LDL DIRECT: 143 mg/dL

## 2017-07-04 LAB — BASIC METABOLIC PANEL
BUN: 12 mg/dL (ref 6–23)
CHLORIDE: 102 meq/L (ref 96–112)
CO2: 30 meq/L (ref 19–32)
CREATININE: 0.65 mg/dL (ref 0.40–1.20)
Calcium: 9.3 mg/dL (ref 8.4–10.5)
GFR: 95.49 mL/min (ref >60.00)
GLUCOSE: 104 mg/dL — AB (ref 70–99)
Potassium: 3.9 mEq/L (ref 3.5–5.1)
Sodium: 140 mEq/L (ref 135–145)

## 2017-07-04 MED ORDER — SIMVASTATIN 20 MG PO TABS: 20.0000 mg | ORAL_TABLET | Freq: Every day | ORAL | 3 refills | Status: DC

## 2017-07-04 NOTE — Assessment & Plan Note (Signed)
She does note some mild depression and anxiety at times given all the things that are going on with her family.  No SI.  Does not want medication for this.  She will continue to monitor.

## 2017-07-04 NOTE — Assessment & Plan Note (Signed)
Encourage dietary changes and exercise. 

## 2017-07-04 NOTE — Progress Notes (Signed)
Sydney AlarEric Medard Decuir, MD Phone: [phone]  Sydney Day is a 72 y.o. female who presents today for follow-up.  Hypertension: Always less than 148/92.  Taking HCTZ.  No chest pain or shortness of breath.  Hyperlipidemia: She stopped her cholesterol medication related to muscle aches.  She is interested in trying simvastatin as she tolerate deviously.  She reports she has been dealing with a lot of stuff at home.  Her husband was diagnosed with dementia.  Her granddaughters have had issues with kidney stones.  She has some anxiety with this.  Some depression previously that she could handle well.  No SI.  She declines mammogram and states she does not want any in the future.  She is going to complete a cologuard as she has this at home.  Not really been working on diet or exercise.  Social History   Tobacco Use  Smoking Status Former Smoker  . Packs/day: 1.00  . Years: 25.00  . Pack years: 25.00  . Types: Cigarettes  Smokeless Tobacco Never Used     ROS see history of present illness  Objective  Physical Exam Vitals:   07/04/17 1038  BP: 120/80  Pulse: 77  Temp: 98.1 F (36.7 C)  SpO2: 96%    BP Readings from Last 3 Encounters:  07/04/17 120/80  01/01/17 140/80  12/31/16 122/82   Wt Readings from Last 3 Encounters:  07/04/17 125 lb (56.7 kg)  01/01/17 125 lb 12.8 oz (57.1 kg)  12/31/16 124 lb 8 oz (56.5 kg)    Physical Exam  Constitutional: No distress.  Cardiovascular: Normal rate, regular rhythm and normal heart sounds.  Pulmonary/Chest: Effort normal and breath sounds normal.  Musculoskeletal: She exhibits no edema.  Neurological: She is alert. Gait normal.  Skin: Skin is warm and dry. She is not diaphoretic.     Assessment/Plan: Please see individual problem list.  Essential hypertension Well-controlled.  Continue current medication.  Caregiver burden She does note some mild depression and anxiety at times given all the things that are going  on with her family.  No SI.  Does not want medication for this.  She will continue to monitor.  Hyperlipidemia Not able to tolerate most statins.  She is interested in trying simvastatin again as she was able to tolerate that fairly well.  Check LDL and hepatic function panel today and in 1 month.  Restart simvastatin.  Overweight (BMI 25.0-29.9) Encourage dietary changes and exercise.  Maintenance: Patient does not want a mammogram.  She will complete the cologuard she has at home.  Bone density test ordered.  Orders Placed This Encounter  Procedures  . DG Bone Density    Standing Status:   Future    Standing Expiration Date:   09/02/2018    Order Specific Question:   Reason for Exam (SYMPTOM  OR DIAGNOSIS REQUIRED)    Answer:   osteoporosis screening    Order Specific Question:   Preferred imaging location?    Answer:   [facility]  . Basic metabolic panel  . LDL cholesterol, direct    Standing Status:   Future    Standing Expiration Date:   07/04/2018  . Hepatic function panel    Standing Status:   Future    Standing Expiration Date:   07/04/2018    Meds ordered this encounter  Medications  . simvastatin (ZOCOR) 20 MG tablet    Sig: Take 1 tablet (20 mg total) by mouth at bedtime.    Dispense:  90  tablet    Refill:  3     Sydney Alar, MD [facility]

## 2017-07-04 NOTE — Assessment & Plan Note (Signed)
Not able to tolerate most statins.  She is interested in trying simvastatin again as she was able to tolerate that fairly well.  Check LDL and hepatic function panel today and in 1 month.  Restart simvastatin.

## 2017-07-04 NOTE — Patient Instructions (Signed)
Nice to see you. We will check lab work today and in 1 month. Please complete the cologuard. We will place you back on simvastatin.  If you develop muscle aches with this please let us know. Please monitor the anxiety and depression and if this for some reason worsens please let us know

## 2017-07-04 NOTE — Assessment & Plan Note (Signed)
Well controlled. Continue current medication.  

## 2017-08-04 ENCOUNTER — Encounter: Payer: Self-pay | Admitting: Family Medicine

## 2017-08-04 ENCOUNTER — Other Ambulatory Visit: Payer: Medicare Other

## 2017-09-09 ENCOUNTER — Ambulatory Visit
Admission: RE | Admit: 2017-09-09 | Discharge: 2017-09-09 | Disposition: A | Discharge status: Home / Self Care | Payer: Medicare Other | Source: Ambulatory Visit | Attending: Family Medicine | Admitting: Family Medicine

## 2017-09-09 DIAGNOSIS — M810 Age-related osteoporosis without current pathological fracture: Secondary | ICD-10-CM | POA: Diagnosis not present

## 2017-09-09 DIAGNOSIS — Z780 Asymptomatic menopausal state: Secondary | ICD-10-CM | POA: Insufficient documentation

## 2017-09-16 ENCOUNTER — Telehealth: Payer: Self-pay

## 2017-09-16 NOTE — Telephone Encounter (Signed)
-----   Message from Glori LuisEric G Sonnenberg, MD sent at 09/12/2017  6:34 PM EDT ----- Noted.  We should not have her take Fosamax then.  Please find out what trouble she is having with swallowing.  Does not include liquids or solids?  How long is been going on?  Has she been evaluated for this?  We may need to have her into the office to discuss possible osteoporosis treatments.  Thanks.

## 2017-09-17 ENCOUNTER — Ambulatory Visit (INDEPENDENT_AMBULATORY_CARE_PROVIDER_SITE_OTHER): Payer: Medicare Other | Admitting: Family Medicine

## 2017-09-17 ENCOUNTER — Encounter: Payer: Self-pay | Admitting: Family Medicine

## 2017-09-17 ENCOUNTER — Other Ambulatory Visit: Payer: Self-pay

## 2017-09-17 VITALS — BP 140/88 | HR 75 | Temp 98.1°F | Wt 126.0 lb

## 2017-09-17 DIAGNOSIS — E785 Hyperlipidemia, unspecified: Secondary | ICD-10-CM | POA: Diagnosis not present

## 2017-09-17 DIAGNOSIS — M810 Age-related osteoporosis without current pathological fracture: Secondary | ICD-10-CM | POA: Insufficient documentation

## 2017-09-17 DIAGNOSIS — I10 Essential (primary) hypertension: Secondary | ICD-10-CM

## 2017-09-17 LAB — COMPREHENSIVE METABOLIC PANEL
ALT: 21 U/L (ref 0–35)
AST: 27 U/L (ref 0–37)
Albumin: 4.3 g/dL (ref 3.5–5.2)
Alkaline Phosphatase: 79 U/L (ref 39–117)
BUN: 8 mg/dL (ref 6–23)
CHLORIDE: 103 meq/L (ref 96–112)
CO2: 30 meq/L (ref 19–32)
Calcium: 9.2 mg/dL (ref 8.4–10.5)
Creatinine, Ser: 0.62 mg/dL (ref 0.40–1.20)
GFR: 100.78 mL/min (ref >60.00)
GLUCOSE: 97 mg/dL (ref 70–99)
POTASSIUM: 4.2 meq/L (ref 3.5–5.1)
Sodium: 140 mEq/L (ref 135–145)
Total Bilirubin: 0.6 mg/dL (ref 0.2–1.2)
Total Protein: 7.4 g/dL (ref 6.0–8.3)

## 2017-09-17 LAB — VITAMIN D 25 HYDROXY (VIT D DEFICIENCY, FRACTURES): VITD: 28.22 ng/mL — ABNORMAL LOW (ref 30.00–100.00)

## 2017-09-17 LAB — LDL CHOLESTEROL, DIRECT: Direct LDL: 133 mg/dL

## 2017-09-17 MED ORDER — DENOSUMAB 60 MG/ML ~~LOC~~ SOLN
60.0000 mg | Freq: Once | SUBCUTANEOUS | Status: AC
Start: 2017-09-17 — End: ?

## 2017-09-17 NOTE — Patient Instructions (Signed)
Nice to see you. We will work on getting you Prolia. You should be taking vitamin D over-the-counter at least 800 international units daily.  You should also be getting 1200 mg of calcium daily from your diet or vitamin supplementation. We will contact you with your lab results.

## 2017-09-17 NOTE — Assessment & Plan Note (Signed)
Tolerating simvastatin.  Check LDL and hepatic function panel.  She will start co-Q10 to help muscle aches.

## 2017-09-17 NOTE — Assessment & Plan Note (Signed)
Controlled at home.  Continue current regimen. 

## 2017-09-17 NOTE — Assessment & Plan Note (Signed)
Recent diagnosis on DEXA scan.  I do not think Fosamax would be a good option given her chronic issues swallowing pills.  We will try to get her approved for Prolia.  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  Lab work as outlined below.

## 2017-09-17 NOTE — Progress Notes (Signed)
  Sydney Rumps, MD Phone: [phone]  Sydney Day is a 72 y.o. female who presents today for f/u.  HYPERTENSION  Disease Monitoring  Home BP Monitoring 170Y systolically Chest pain- no    Dyspnea- no Medications  Compliance-  Taking HCTZ.  Edema- no  HYPERLIPIDEMIA Symptoms Chest pain on exertion:  no   Medications: Compliance- taking simvastatin Right upper quadrant pain- no  Muscle aches- minimal that are chronic  Osteoporosis: Found on recent DEXA scan.  She has been taking a multivitamin though no specific calcium or vitamin D.  Does have a history of fractures in the past.  She has issues swallowing whole pills and this has been going on since she was a child when she had a choking episode.  She can take pills if she can crush them.   Social History   Tobacco Use  Smoking Status Former Smoker  . Packs/day: 1.00  . Years: 25.00  . Pack years: 25.00  . Types: Cigarettes  Smokeless Tobacco Never Used     ROS see history of present illness  Objective  Physical Exam Vitals:   09/17/17 1133  BP: 140/88  Pulse: 75  Temp: 98.1 F (36.7 C)  SpO2: 98%    BP Readings from Last 3 Encounters:  09/17/17 140/88  07/04/17 120/80  01/01/17 140/80   Wt Readings from Last 3 Encounters:  09/17/17 126 lb (57.2 kg)  07/04/17 125 lb (56.7 kg)  01/01/17 125 lb 12.8 oz (57.1 kg)    Physical Exam  Constitutional: No distress.  Cardiovascular: Normal rate, regular rhythm and normal heart sounds.  Pulmonary/Chest: Effort normal and breath sounds normal.  Musculoskeletal: She exhibits no edema.  Neurological: She is alert.  Skin: Skin is warm and dry. She is not diaphoretic.     Assessment/Plan: Please see individual problem list.  Essential hypertension Controlled at home.  Continue current regimen.  Hyperlipidemia Tolerating simvastatin.  Check LDL and hepatic function panel.  She will start co-Q10 to help muscle aches.  Osteoporosis Recent diagnosis  on DEXA scan.  I do not think Fosamax would be a good option given her chronic issues swallowing pills.  We will try to get her approved for Prolia.  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  Lab work as outlined below.   Orders Placed This Encounter  Procedures  . Comp Met (CMET)  . Direct LDL  . Vitamin D (25 hydroxy)    Meds ordered this encounter  Medications  . denosumab (PROLIA) injection 60 mg     Sydney Rumps, MD [place]

## 2017-09-19 ENCOUNTER — Encounter: Payer: Self-pay | Admitting: *Deleted

## 2017-09-24 NOTE — Telephone Encounter (Signed)
Unread mychart message mailed to patient 

## 2017-10-03 ENCOUNTER — Telehealth: Payer: Self-pay | Admitting: Family Medicine

## 2017-10-03 NOTE — Telephone Encounter (Signed)
Insurance verification for Prolia filed on [employer] Portal. 

## 2017-10-06 ENCOUNTER — Telehealth: Payer: Self-pay | Admitting: Family Medicine

## 2017-10-06 DIAGNOSIS — E785 Hyperlipidemia, unspecified: Secondary | ICD-10-CM

## 2017-10-06 MED ORDER — SIMVASTATIN 40 MG PO TABS: 40.0000 mg | ORAL_TABLET | Freq: Every day | ORAL | 1 refills | Status: DC

## 2017-10-06 NOTE — Telephone Encounter (Signed)
Received approval for Prolia ok to schedule?

## 2017-10-06 NOTE — Telephone Encounter (Signed)
It is okay to start on Prolia.  Please make sure she is taking vitamin D 800 international units daily.  If she is not she should start on this prior to getting her first Prolia injection.  Thanks.

## 2017-10-06 NOTE — Telephone Encounter (Signed)
Please advise 

## 2017-10-06 NOTE — Telephone Encounter (Signed)
Left message for patient  To return call to office. [facility] nurse may advise of PCP advice.

## 2017-10-06 NOTE — Telephone Encounter (Signed)
Advised patient of message. She will start the vitamin D 800 and get that in her system and call back to set up the shot.

## 2017-10-06 NOTE — Telephone Encounter (Signed)
Copied from CRM [id number]. Topic: Quick Communication - See Telephone Encounter >> Oct 06, 2017  4:03 PM Terisa Starr wrote: CRM for notification. See Telephone encounter for: 10/06/17.  Patient wants to know does she need to increase her  simvastatin (ZOCOR) 20 MG tablet to ?

## 2017-10-06 NOTE — Telephone Encounter (Signed)
She should increase her simvastatin dose to 40 mg daily.  I will send in a new prescription and place orders for follow-up labs.  Please get her scheduled for follow-up labs in 1 month.

## 2017-10-07 NOTE — Telephone Encounter (Signed)
Left message to return call, ok for pec to inform patient of message below and schedule fasting 1 month lab appointment

## 2017-11-06 ENCOUNTER — Other Ambulatory Visit (INDEPENDENT_AMBULATORY_CARE_PROVIDER_SITE_OTHER): Payer: Medicare Other

## 2017-11-06 DIAGNOSIS — E785 Hyperlipidemia, unspecified: Secondary | ICD-10-CM

## 2017-11-06 LAB — LDL CHOLESTEROL, DIRECT: LDL DIRECT: 139 mg/dL

## 2017-11-06 LAB — HEPATIC FUNCTION PANEL
ALBUMIN: 4.1 g/dL (ref 3.5–5.2)
ALT: 20 U/L (ref 0–35)
AST: 24 U/L (ref 0–37)
Alkaline Phosphatase: 86 U/L (ref 39–117)
BILIRUBIN TOTAL: 0.6 mg/dL (ref 0.2–1.2)
Bilirubin, Direct: 0.1 mg/dL (ref 0.0–0.3)
Total Protein: 7.4 g/dL (ref 6.0–8.3)

## 2017-11-07 ENCOUNTER — Other Ambulatory Visit: Payer: Self-pay | Admitting: Family Medicine

## 2017-11-07 DIAGNOSIS — E785 Hyperlipidemia, unspecified: Secondary | ICD-10-CM

## 2017-11-07 MED ORDER — ATORVASTATIN CALCIUM 40 MG PO TABS: 40.0000 mg | ORAL_TABLET | Freq: Every day | ORAL | 3 refills | Status: DC

## 2017-12-02 DIAGNOSIS — S91332A Puncture wound without foreign body, left foot, initial encounter: Secondary | ICD-10-CM | POA: Diagnosis not present

## 2017-12-02 DIAGNOSIS — Z23 Encounter for immunization: Secondary | ICD-10-CM | POA: Diagnosis not present

## 2017-12-04 ENCOUNTER — Other Ambulatory Visit: Payer: Medicare Other

## 2017-12-19 ENCOUNTER — Ambulatory Visit: Payer: Medicare Other

## 2018-01-01 ENCOUNTER — Encounter: Payer: Self-pay | Admitting: Family Medicine

## 2018-01-01 ENCOUNTER — Ambulatory Visit (INDEPENDENT_AMBULATORY_CARE_PROVIDER_SITE_OTHER): Payer: Medicare Other | Admitting: Family Medicine

## 2018-01-01 VITALS — BP 122/88 | HR 82 | Temp 98.0°F | Ht <= 58 in | Wt 124.0 lb

## 2018-01-01 DIAGNOSIS — S99929A Unspecified injury of unspecified foot, initial encounter: Secondary | ICD-10-CM | POA: Insufficient documentation

## 2018-01-01 DIAGNOSIS — S99922A Unspecified injury of left foot, initial encounter: Secondary | ICD-10-CM | POA: Diagnosis not present

## 2018-01-01 DIAGNOSIS — M810 Age-related osteoporosis without current pathological fracture: Secondary | ICD-10-CM | POA: Diagnosis not present

## 2018-01-01 DIAGNOSIS — E785 Hyperlipidemia, unspecified: Secondary | ICD-10-CM

## 2018-01-01 DIAGNOSIS — I10 Essential (primary) hypertension: Secondary | ICD-10-CM

## 2018-01-01 MED ORDER — ATORVASTATIN CALCIUM 40 MG PO TABS: ORAL_TABLET | ORAL | 3 refills | Status: DC

## 2018-01-01 NOTE — Assessment & Plan Note (Signed)
Improved on recheck.  She will return in 2 weeks to have it rechecked by nursing.  She will check daily and write it down.  If above goal at home consider altering her regimen.

## 2018-01-01 NOTE — Patient Instructions (Signed)
Nice to see you.  We will refer you to rheumatology for your osteoporosis.  Please take the lipitor on Monday, Wednesday, and Friday.  Please continue to monitor your BP daily. We will have you come back in 2 weeks with your cuff to compare to ours.

## 2018-01-01 NOTE — Assessment & Plan Note (Addendum)
Trial of Lipitor Monday Wednesday Friday.

## 2018-01-01 NOTE — Assessment & Plan Note (Signed)
Well-healing.  Adequately treated.  Monitor for further issues with this.

## 2018-01-01 NOTE — Assessment & Plan Note (Signed)
Refer to rheumatology to determine best treatment given history of osteonecrosis of jaw.

## 2018-01-01 NOTE — Progress Notes (Signed)
  Marikay AlarEric Rejina Odle, MD Phone: [phone]  Sydney SpruceVirginia D Day is a 72 y.o. female who presents today for f/u.  CC: htn, hld, osteoporosis, stepped on nail  HYPERTENSION  Disease Monitoring  Home BP Monitoring similar to here Chest pain- no    Dyspnea- no Medications  Compliance-  Taking HCTZ.  Edema- no  Hyperlipidemia: She could not tolerate the Lipitor daily due to achiness.  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  She would be interested in an alternative treatment.  She reports she stepped on a nail with her left foot while moving a fence in her yard.  She was evaluated at the walk-in clinic and given a tetanus vaccination as well as ciprofloxacin and doxycycline.  She has had no issues.  She finished the antibiotics.    Social History   Tobacco Use  Smoking Status Former Smoker  . Packs/day: 1.00  . Years: 25.00  . Pack years: 25.00  . Types: Cigarettes  Smokeless Tobacco Never Used     ROS see history of present illness  Objective  Physical Exam Vitals:   01/01/18 1043 01/01/18 1120  BP: 140/90 122/88  Pulse: 82   Temp: 98 F (36.7 C)   SpO2: 97%     BP Readings from Last 3 Encounters:  01/01/18 122/88  09/17/17 140/88  07/04/17 120/80   Wt Readings from Last 3 Encounters:  01/01/18 124 lb (56.2 kg)  09/17/17 126 lb (57.2 kg)  07/04/17 125 lb (56.7 kg)    Physical Exam  Constitutional: No distress.  Cardiovascular: Normal rate, regular rhythm and normal heart sounds.  Pulmonary/Chest: Effort normal and breath sounds normal.  Musculoskeletal: She exhibits no edema.  Well healed wound on the bottom of her left foot, no tenderness  Neurological: She is alert.  Skin: Skin is warm and dry. She is not diaphoretic.     Assessment/Plan: Please see individual problem list.  Hyperlipidemia Trial of Lipitor Monday Wednesday  Friday.  Essential hypertension Improved on recheck.  She will return in 2 weeks to have it rechecked by nursing.  She will check daily and write it down.  If above goal at home consider altering her regimen.  Osteoporosis Refer to rheumatology to determine best treatment given history of osteonecrosis of jaw.  Foot injury Well-healing.  Adequately treated.  Monitor for further issues with this.    Orders Placed This Encounter  Procedures  . Ambulatory referral to Rheumatology    Referral Priority:   Routine    Referral Type:   Consultation    Referral Reason:   Specialty Services Required    Requested Specialty:   Rheumatology    Number of Visits Requested:   1    Meds ordered this encounter  Medications  . atorvastatin (LIPITOR) 40 MG tablet    Sig: Take 1 tablet by mouth on Monday, Wednesday, and Friday    Dispense:  12 tablet    Refill:  3     Marikay AlarEric Ndrew Creason, MD [facility]

## 2018-01-08 ENCOUNTER — Telehealth: Payer: Self-pay | Admitting: Family Medicine

## 2018-01-08 MED ORDER — HYDROCHLOROTHIAZIDE 12.5 MG PO CAPS: 12.5000 mg | ORAL_CAPSULE | Freq: Every day | ORAL | 3 refills | Status: DC

## 2018-01-08 NOTE — Telephone Encounter (Signed)
Will route to office for final disposition; LOV 01/01/18; also see [id number].

## 2018-01-08 NOTE — Telephone Encounter (Signed)
Copied from CRM [id number]. Topic: Quick Communication - See Telephone Encounter >> Jan 08, 2018 10:38 AM Tamela OddiMartin, Don'Quashia, NT wrote: CRM for notification. See Telephone encounter for: 01/08/18. Jody calling from [facility] is requesting a new RX be sent over for the patient . Her old pharmacy is no longer in business. Please send her hydrochlorothiazide (MICROZIDE) 12.5 MG capsule   [facility] [place] [address] [phone] (Phone) [phone] (Fax)

## 2018-01-08 NOTE — Addendum Note (Signed)
Addended by: Inetta FermoHENDRICKS, JESSICA S on: 01/08/2018 04:50 PM   Modules accepted: Orders

## 2018-01-08 NOTE — Telephone Encounter (Signed)
Sent to pharmacy 

## 2018-01-21 ENCOUNTER — Ambulatory Visit: Payer: Medicare Other

## 2018-01-28 ENCOUNTER — Ambulatory Visit: Payer: Medicare Other

## 2018-03-17 DIAGNOSIS — J40 Bronchitis, not specified as acute or chronic: Secondary | ICD-10-CM | POA: Diagnosis not present

## 2018-03-19 ENCOUNTER — Ambulatory Visit: Payer: Medicare Other | Admitting: Family Medicine

## 2018-03-20 ENCOUNTER — Ambulatory Visit: Payer: Medicare Other | Admitting: Family Medicine

## 2018-04-20 ENCOUNTER — Ambulatory Visit: Payer: Medicare Other | Admitting: Family Medicine

## 2018-05-21 DIAGNOSIS — H35372 Puckering of macula, left eye: Secondary | ICD-10-CM | POA: Diagnosis not present

## 2018-05-25 DIAGNOSIS — Z23 Encounter for immunization: Secondary | ICD-10-CM | POA: Diagnosis not present

## 2018-06-30 DIAGNOSIS — E785 Hyperlipidemia, unspecified: Secondary | ICD-10-CM | POA: Diagnosis not present

## 2018-06-30 DIAGNOSIS — I10 Essential (primary) hypertension: Secondary | ICD-10-CM | POA: Diagnosis not present

## 2018-07-28 DIAGNOSIS — E785 Hyperlipidemia, unspecified: Secondary | ICD-10-CM | POA: Diagnosis not present

## 2018-07-28 DIAGNOSIS — M810 Age-related osteoporosis without current pathological fracture: Secondary | ICD-10-CM | POA: Diagnosis not present

## 2018-07-28 DIAGNOSIS — Z1159 Encounter for screening for other viral diseases: Secondary | ICD-10-CM | POA: Diagnosis not present

## 2018-07-28 DIAGNOSIS — I10 Essential (primary) hypertension: Secondary | ICD-10-CM | POA: Diagnosis not present

## 2018-12-25 DIAGNOSIS — M810 Age-related osteoporosis without current pathological fracture: Secondary | ICD-10-CM | POA: Diagnosis not present

## 2018-12-25 DIAGNOSIS — R946 Abnormal results of thyroid function studies: Secondary | ICD-10-CM | POA: Diagnosis not present

## 2019-01-13 DIAGNOSIS — M810 Age-related osteoporosis without current pathological fracture: Secondary | ICD-10-CM | POA: Diagnosis not present

## 2019-01-14 DIAGNOSIS — I10 Essential (primary) hypertension: Secondary | ICD-10-CM | POA: Diagnosis not present

## 2019-01-14 DIAGNOSIS — E785 Hyperlipidemia, unspecified: Secondary | ICD-10-CM | POA: Diagnosis not present

## 2019-02-09 DIAGNOSIS — E785 Hyperlipidemia, unspecified: Secondary | ICD-10-CM | POA: Diagnosis not present

## 2019-02-09 DIAGNOSIS — I10 Essential (primary) hypertension: Secondary | ICD-10-CM | POA: Diagnosis not present

## 2019-02-11 DIAGNOSIS — M810 Age-related osteoporosis without current pathological fracture: Secondary | ICD-10-CM | POA: Diagnosis not present

## 2019-02-22 ENCOUNTER — Other Ambulatory Visit: Payer: Self-pay | Admitting: *Deleted

## 2019-02-22 DIAGNOSIS — Z20822 Contact with and (suspected) exposure to covid-19: Secondary | ICD-10-CM

## 2019-02-23 LAB — NOVEL CORONAVIRUS, NAA: SARS-CoV-2, NAA: NOT DETECTED

## 2019-03-03 DIAGNOSIS — Z23 Encounter for immunization: Secondary | ICD-10-CM | POA: Diagnosis not present

## 2019-03-16 DIAGNOSIS — H2511 Age-related nuclear cataract, right eye: Secondary | ICD-10-CM | POA: Diagnosis not present

## 2019-03-25 DIAGNOSIS — I10 Essential (primary) hypertension: Secondary | ICD-10-CM | POA: Diagnosis not present

## 2019-04-29 DIAGNOSIS — I10 Essential (primary) hypertension: Secondary | ICD-10-CM | POA: Diagnosis not present

## 2019-04-29 DIAGNOSIS — E785 Hyperlipidemia, unspecified: Secondary | ICD-10-CM | POA: Diagnosis not present

## 2019-06-16 DIAGNOSIS — E785 Hyperlipidemia, unspecified: Secondary | ICD-10-CM | POA: Diagnosis not present

## 2019-06-16 DIAGNOSIS — I10 Essential (primary) hypertension: Secondary | ICD-10-CM | POA: Diagnosis not present

## 2019-06-23 DIAGNOSIS — E785 Hyperlipidemia, unspecified: Secondary | ICD-10-CM | POA: Diagnosis not present

## 2019-06-23 DIAGNOSIS — I10 Essential (primary) hypertension: Secondary | ICD-10-CM | POA: Diagnosis not present

## 2019-06-23 DIAGNOSIS — M810 Age-related osteoporosis without current pathological fracture: Secondary | ICD-10-CM | POA: Diagnosis not present

## 2019-07-14 DIAGNOSIS — I10 Essential (primary) hypertension: Secondary | ICD-10-CM | POA: Diagnosis not present

## 2019-07-14 DIAGNOSIS — E785 Hyperlipidemia, unspecified: Secondary | ICD-10-CM | POA: Diagnosis not present

## 2019-08-11 DIAGNOSIS — I10 Essential (primary) hypertension: Secondary | ICD-10-CM | POA: Diagnosis not present

## 2019-08-11 DIAGNOSIS — E785 Hyperlipidemia, unspecified: Secondary | ICD-10-CM | POA: Diagnosis not present

## 2019-08-30 DIAGNOSIS — M810 Age-related osteoporosis without current pathological fracture: Secondary | ICD-10-CM | POA: Diagnosis not present

## 2019-09-27 DIAGNOSIS — I10 Essential (primary) hypertension: Secondary | ICD-10-CM | POA: Diagnosis not present

## 2019-09-27 DIAGNOSIS — E785 Hyperlipidemia, unspecified: Secondary | ICD-10-CM | POA: Diagnosis not present

## 2019-09-27 DIAGNOSIS — M810 Age-related osteoporosis without current pathological fracture: Secondary | ICD-10-CM | POA: Diagnosis not present

## 2019-10-13 DIAGNOSIS — I10 Essential (primary) hypertension: Secondary | ICD-10-CM | POA: Diagnosis not present

## 2019-10-13 DIAGNOSIS — E785 Hyperlipidemia, unspecified: Secondary | ICD-10-CM | POA: Diagnosis not present

## 2019-12-15 DIAGNOSIS — E785 Hyperlipidemia, unspecified: Secondary | ICD-10-CM | POA: Diagnosis not present

## 2019-12-15 DIAGNOSIS — I10 Essential (primary) hypertension: Secondary | ICD-10-CM | POA: Diagnosis not present

## 2019-12-21 DIAGNOSIS — M810 Age-related osteoporosis without current pathological fracture: Secondary | ICD-10-CM | POA: Diagnosis not present

## 2019-12-21 DIAGNOSIS — E785 Hyperlipidemia, unspecified: Secondary | ICD-10-CM | POA: Diagnosis not present

## 2019-12-21 DIAGNOSIS — I10 Essential (primary) hypertension: Secondary | ICD-10-CM | POA: Diagnosis not present

## 2020-01-10 DIAGNOSIS — E785 Hyperlipidemia, unspecified: Secondary | ICD-10-CM | POA: Diagnosis not present

## 2020-01-10 DIAGNOSIS — I10 Essential (primary) hypertension: Secondary | ICD-10-CM | POA: Diagnosis not present

## 2020-02-02 DIAGNOSIS — L255 Unspecified contact dermatitis due to plants, except food: Secondary | ICD-10-CM | POA: Diagnosis not present

## 2020-02-09 DIAGNOSIS — H2511 Age-related nuclear cataract, right eye: Secondary | ICD-10-CM | POA: Diagnosis not present

## 2020-02-09 DIAGNOSIS — H35372 Puckering of macula, left eye: Secondary | ICD-10-CM | POA: Diagnosis not present

## 2020-02-23 DIAGNOSIS — Z23 Encounter for immunization: Secondary | ICD-10-CM | POA: Diagnosis not present

## 2020-02-24 DIAGNOSIS — I10 Essential (primary) hypertension: Secondary | ICD-10-CM | POA: Diagnosis not present

## 2020-02-24 DIAGNOSIS — E785 Hyperlipidemia, unspecified: Secondary | ICD-10-CM | POA: Diagnosis not present

## 2020-03-09 ENCOUNTER — Telehealth: Payer: Self-pay

## 2020-03-09 NOTE — Telephone Encounter (Signed)
Called and asked if the patient is still being seen at our office. Sydney Day states that she left our office about 1 year ago as we were unable to see her immediately for a Vaccine after she stepped on a nail. She stated that we asked her to wait 3 days before she could come in and she had gotten sick during the time that she was waiting. She states that walk in clinics are a better source of care than what our office provides and she will continue going to a walk in clinic than come to our office.

## 2020-03-22 DIAGNOSIS — M810 Age-related osteoporosis without current pathological fracture: Secondary | ICD-10-CM | POA: Diagnosis not present

## 2020-04-05 DIAGNOSIS — Z23 Encounter for immunization: Secondary | ICD-10-CM | POA: Diagnosis not present

## 2020-05-02 DIAGNOSIS — I10 Essential (primary) hypertension: Secondary | ICD-10-CM | POA: Diagnosis not present

## 2020-05-02 DIAGNOSIS — E785 Hyperlipidemia, unspecified: Secondary | ICD-10-CM | POA: Diagnosis not present

## 2020-05-29 DIAGNOSIS — E785 Hyperlipidemia, unspecified: Secondary | ICD-10-CM | POA: Diagnosis not present

## 2020-05-29 DIAGNOSIS — I10 Essential (primary) hypertension: Secondary | ICD-10-CM | POA: Diagnosis not present

## 2023-09-04 ENCOUNTER — Ambulatory Visit
Admission: RE | Admit: 2023-09-04 | Discharge: 2023-09-04 | Disposition: A | Discharge status: Home / Self Care | Source: Ambulatory Visit | Attending: Pulmonary Disease | Admitting: Pulmonary Disease

## 2023-09-04 ENCOUNTER — Ambulatory Visit: Admitting: Pulmonary Disease

## 2023-09-04 ENCOUNTER — Encounter: Payer: Self-pay | Admitting: Pulmonary Disease

## 2023-09-04 VITALS — BP 128/80 | HR 78 | Temp 97.6°F | Ht <= 58 in | Wt 123.2 lb

## 2023-09-04 DIAGNOSIS — J40 Bronchitis, not specified as acute or chronic: Secondary | ICD-10-CM | POA: Diagnosis present

## 2023-09-04 DIAGNOSIS — J329 Chronic sinusitis, unspecified: Secondary | ICD-10-CM | POA: Diagnosis not present

## 2023-09-04 DIAGNOSIS — R0602 Shortness of breath: Secondary | ICD-10-CM

## 2023-09-04 DIAGNOSIS — J45909 Unspecified asthma, uncomplicated: Secondary | ICD-10-CM

## 2023-09-04 LAB — NITRIC OXIDE: Nitric Oxide: 27

## 2023-09-04 MED ORDER — ALBUTEROL SULFATE HFA 108 (90 BASE) MCG/ACT IN AERS: 2.0000 | INHALATION_SPRAY | Freq: Four times a day (QID) | RESPIRATORY_TRACT | 2 refills | Status: DC | PRN

## 2023-09-04 NOTE — Progress Notes (Signed)
 Subjective:    Patient ID: Sydney Day, female    DOB: 01-11-1946, 78 y.o.   MRN: [id number]  Patient Care Team: Salena Saner, MD as Consulting Physician (Pulmonary Disease)  Chief Complaint  Patient presents with   Consult    Patient reports recurrent bronchitis. Cough, shortness of breath on exertion and at rest. Wheezing.     BACKGROUND: Patient is a 78 year old remote former smoker who presents for evaluation of recurrent bouts of bronchitis, cough shortness of breath and wheezing.  She is self-referred.  Her primary care practitioner is Shane Crutch, PA-C at the [facility].  HPI  Discussed the use of AI scribe software for clinical note transcription with the patient, who gave verbal consent to proceed.  History of Present Illness   Sydney D Baines "Amada Jupiter" is a 78 year old female who presents with shortness of breath and cough.  She has been experiencing shortness of breath and cough for over a year. The episodes are characterized by sudden difficulty breathing, often accompanied by a cough, and occur even at rest, such as when watching TV.  She has a history of recurrent bronchitis, which she often mistook for a cold. Recently, she had an episode of sinusitis and sought care at a walk-in clinic.  This was to weeks ago.  Her past medical history includes intermittent asthma during childhood.  She smoked for approximately 25 to 30 years, starting in her early twenties, but quit over 25 years ago after a period of heavy smoking. She has not used any inhalers regularly, although she was previously prescribed Spiriva, which she did not use consistently.  Her family history is significant for heart disease, with both parents and grandparents having had heart attacks. Her mother lived to 27 but had a heart valve replacement in her eighties and later passed away from a heart attack after a hospitalization for pneumonia.   No recent fevers, chills or  sweats.  When she coughs is mostly dry, no hemoptysis.  Has not had prior pulmonary evaluation, no recent chest x-ray.  No PFTs.    Review of Systems A 10 point review of systems was performed and it is as noted above otherwise negative.   Past Medical History:  Diagnosis Date   Chickenpox    Dysrhythmia    GERD (gastroesophageal reflux disease)    HOH (hard of hearing)    Hyperlipidemia    Hypertension    Palpitations    UTI (lower urinary tract infection)     Past Surgical History:  Procedure Laterality Date   CATARACT EXTRACTION W/PHACO Left 07/17/2016   Procedure: CATARACT EXTRACTION PHACO AND INTRAOCULAR LENS PLACEMENT (IOC);  Surgeon: Sallee Lange, MD;  Location: [facility] ORS;  Service: Ophthalmology;  Laterality: Left;  [place] 01:18 AP% 24.4 CDE 36.89 Fluid pack lot # [id number]   CESAREAN SECTION      Patient Active Problem List   Diagnosis Date Noted   Foot injury 01/01/2018   Osteoporosis 09/17/2017   Bilateral knee pain 01/01/2017   Overweight (BMI 25.0-29.9) 09/26/2016   Caregiver burden 09/26/2016   Dysfunction of left eustachian tube 09/28/2015   Palpitations 08/28/2015   Essential hypertension 07/31/2015   Stress incontinence 07/31/2015   Chest pain 12/09/2011   Hyperlipidemia     Family History  Problem Relation Age of Onset   Heart failure Mother    Hyperlipidemia Mother    Kidney disease Mother    Heart attack Father 54   Heart attack  Paternal Grandfather    Hyperlipidemia Sister    Arthritis Unknown    Breast cancer Unknown    Hypertension Unknown    Hyperlipidemia Unknown    Dementia Maternal Grandfather    Kidney disease Son     Social History   Tobacco Use   Smoking status: Former    Current packs/day: 0.00    Average packs/day: 1 pack/day for 20.0 years (20.0 ttl pk-yrs)    Types: Cigarettes    Start date: 06/19/1966    Quit date: 06/19/1986    Years since quitting: 37.2   Smokeless tobacco: Never  Substance Use Topics   Alcohol  use: No    Allergies  Allergen Reactions   Codeine     nausea   Rosuvastatin Other (See Comments)    Muscle aches    Current Meds  Medication Sig   aspirin 81 MG tablet Take 81 mg by mouth daily.   Coenzyme Q10 (CO Q-10) 200 MG CAPS Take 1 tablet by mouth daily in the afternoon.   hydrochlorothiazide (MICROZIDE) 12.5 MG capsule Take 1 capsule (12.5 mg total) by mouth daily.   simvastatin (ZOCOR) 20 MG tablet Take 10 mg by mouth daily.   Vitamin D, Cholecalciferol, 25 MCG (1000 UT) TABS Take 1 tablet by mouth daily in the afternoon.   Current Facility-Administered Medications for the 09/04/23 encounter (Office Visit) with Salena Saner, MD  Medication   denosumab [employer]) injection 60 mg    Immunization History  Administered Date(s) Administered   Influenza-Unspecified 03/27/2017, 04/10/2018   Pneumococcal Conjugate-13 07/31/2015   Pneumococcal Polysaccharide-23 12/18/2016   Tdap 12/02/2017        Objective:     BP 128/80 (BP Location: Left Arm, Patient Position: Sitting, Cuff Size: Normal)   Pulse 78   Temp 97.6 F (36.4 C) (Temporal)   Ht 4\' 10"  (1.473 m)   Wt 123 lb 3.2 oz (55.9 kg)   SpO2 98%   BMI 25.75 kg/m   SpO2: 98 %  GENERAL: Well-developed, well-nourished, short statured elderly woman, no acute distress.  Fully ambulatory, no conversational dyspnea. HEAD: Normocephalic, atraumatic.  EYES: Pupils equal, round, reactive to light.  No scleral icterus.  MOUTH: Poor dentition, oral mucosa moist.  No thrush.  NECK: Supple. No thyromegaly. Trachea midline. No JVD.  No adenopathy. CHEST: Significant kyphosis.  Creased AP diameter. PULMONARY: Good air entry bilaterally.  Rare end expiratory wheeze. CARDIOVASCULAR: S1 and S2. Regular rate and rhythm.  No rubs, murmurs or gallops heard. ABDOMEN: Benign. MUSCULOSKELETAL: No joint deformity, no clubbing, no edema.  NEUROLOGIC: No overt focal deficit, no gait disturbance, speech is fluent. SKIN:  Intact,warm,dry. PSYCH: Mood and behavior normal  Lab Results  Component Value Date   NITRICOXIDE 27 09/04/2023  *No significant type II inflammation   Assessment & Plan:     ICD-10-CM   1. Shortness of breath  R06.02 DG Chest 2 View    Pulmonary Function Test [facility] Only    Nitric oxide    2. Chronic sinusitis with recurrent bronchitis  J32.9 DG Chest 2 View   J40 Pulmonary Function Test [facility] Only    3. Asthmatic bronchitis without complication, unspecified asthma severity, unspecified whether persistent  J45.909       Orders Placed This Encounter  Procedures   DG Chest 2 View    Standing Status:   Future    Expiration Date:   09/03/2024    Reason for Exam (SYMPTOM  OR DIAGNOSIS REQUIRED):   Shortness  of breath, recurrent bronchitis    Preferred imaging location?:   [facility]   Pulmonary Function Test [facility] Only    Standing Status:   Future    Expected Date:   09/18/2023    Expiration Date:   09/03/2024    Full PFT: includes the following: basic spirometry, spirometry pre & post bronchodilator, diffusion capacity (DLCO), lung volumes:   Full PFT    This test can only be performed at:   [facility]   Nitric oxide   Meds ordered this encounter  Medications   albuterol (VENTOLIN HFA) 108 (90 Base) MCG/ACT inhaler    Sig: Inhale 2 puffs into the lungs every 6 (six) hours as needed for wheezing or shortness of breath.    Dispense:  8 g    Refill:  2   Discussion:    Asthmatic bronchitis Presents with symptoms consistent with asthmatic bronchitis, including shortness of breath and cough. She has a 25-30 year smoking history, having quit over 25 years ago, and intermittent asthma since childhood. Airway i nitric oxide is 27 ppb with some wheezing noted on exam. Suspected asthmatic bronchitis to be confirmed with further testing. - Order pulmonary function tests to assess lung function - Order chest x-ray to evaluate lung condition - Prescribe albuterol inhaler for  use as needed for dyspnea/wheezing - Provide a trial of the rescue inhaler and assess the need for a maintenance inhaler after test results - Ensure results are shared with primary care provider Shane Crutch, PA-C  Atrial fibrillation She has paroxysmal atrial fibrillation with a significant family history of heart disease.  Recent episode.  Both parents died of myocardial infarctions, and her mother had a heart valve replacement in her eighties.  Goals of Care She expressed distress over her mother's experience with a do not resuscitate order and her passing due to a myocardial infarction during the COVID-19 pandemic, emphasizing the importance of respecting advanced directives and patient autonomy in end-of-life care.     Advised if symptoms do not improve or worsen, to please contact office for sooner follow up or seek emergency care.    I spent 45 minutes of dedicated to the care of this patient on the date of this encounter to include pre-visit review of records, face-to-face time with the patient discussing conditions above, post visit ordering of testing, clinical documentation with the electronic health record, making appropriate referrals as documented, and communicating necessary findings to members of the patients care team.   C. Danice Goltz, MD Advanced Bronchoscopy PCCM [facility]    *This note was dictated using voice recognition software/Dragon.  Despite best efforts to proofread, errors can occur which can change the meaning. Any transcriptional errors that result from this process are unintentional and may not be fully corrected at the time of dictation.

## 2023-09-04 NOTE — Patient Instructions (Signed)
 VISIT SUMMARY:  Today, we discussed your ongoing shortness of breath and cough, which you have been experiencing for over a year. We reviewed your history of recurrent bronchitis, past smoking, and family history of heart disease. We also talked about your concerns regarding end-of-life care based on your mother's experience.  YOUR PLAN:  -ASTHMATIC BRONCHITIS: Asthmatic bronchitis is a condition where the airways in your lungs become inflamed and produce excess mucus, leading to coughing and difficulty breathing. We will conduct pulmonary function tests and a chest x-ray to confirm this diagnosis. In the meantime, you are prescribed an albuterol inhaler to use as needed for shortness of breath. We will assess the need for a maintenance inhaler after reviewing your test results.  -ATRIAL FIBRILLATION: Atrial fibrillation is an irregular and often rapid heart rate that can increase your risk of strokes, heart failure, and other heart-related complications. Given your significant family history of heart disease, we will continue to monitor this condition closely.  -GOALS OF CARE: We discussed your concerns about end-of-life care, particularly in light of your mother's experience. It is important to respect advanced directives and ensure patient autonomy in end-of-life decisions. We will make sure your wishes are documented and respected.  INSTRUCTIONS:  Please complete the pulmonary function tests and chest x-ray as soon as possible. Use the albuterol inhaler as needed for shortness of breath. We will review the results and determine if a maintenance inhaler is necessary. Ensure that your primary care physician, Dr. Delaney Meigs, receives all test results. Follow up with [place] after completing the tests to discuss the next steps.

## 2023-09-16 ENCOUNTER — Encounter: Payer: Self-pay | Admitting: Ophthalmology

## 2023-09-16 NOTE — Anesthesia Preprocedure Evaluation (Addendum)
 Anesthesia Evaluation  Patient identified by MRN, date of birth, ID band Patient awake    Reviewed: Allergy & Precautions, H&P , NPO status , Patient's Chart, lab work & pertinent test results  History of Anesthesia Complications (+) PONV and history of anesthetic complications  Airway Mallampati: III  TM Distance: >3 FB Neck ROM: Full    Dental no notable dental hx. (+) Upper Dentures, Partial Lower   Pulmonary asthma , COPD, former smoker Auscultates slight wheezing today, some non-productive cough, states diagnosed with chronic, intermittent asthmatic bronchitis ("it comes and it goes").  Seldom uses inhaler.    Pulmonary exam normal breath sounds clear to auscultation + wheezing      Cardiovascular hypertension, Normal cardiovascular exam+ dysrhythmias  Rhythm:Regular Rate:Normal     Neuro/Psych negative neurological ROS  negative psych ROS   GI/Hepatic negative GI ROS, Neg liver ROS,GERD  ,,  Endo/Other  negative endocrine ROS    Renal/GU negative Renal ROS  negative genitourinary   Musculoskeletal negative musculoskeletal ROS (+) Arthritis ,    Abdominal   Peds negative pediatric ROS (+)  Hematology negative hematology ROS (+)   Anesthesia Other Findings Office  note: 09-04-23 ongoing shortness of breath and cough, which you have been experiencing for over a year.  X3 days SOB after coughing and at random times -used to have an inhaler but not since 2 years and feels like she needs it now. Coughing clear yellowish phlegm Voice is almost completely gone Fatigued Chills Denies fevers or body aches Denies N/V/DDenies chest painVicks Vapor Rub , Dayquil,  Office note resolved 09-17-23  Hyperlipidemia  Hypertension Dysrhythmia  GERD (gastroesophageal reflux disease) HOH (hard of hearing)  Palpitations Asthma  Arthritis PONV (postoperative nausea and vomiting) COPD (chronic obstructive pulmonary disease)  noted on CXR Chronic sinusitis with recurrent bronchitis    Reproductive/Obstetrics negative OB ROS                             Anesthesia Physical Anesthesia Plan  ASA: 3  Anesthesia Plan: MAC   Post-op Pain Management:    Induction: Intravenous  PONV Risk Score and Plan:   Airway Management Planned: Natural Airway and Nasal Cannula  Additional Equipment:   Intra-op Plan:   Post-operative Plan:   Informed Consent: I have reviewed the patients History and Physical, chart, labs and discussed the procedure including the risks, benefits and alternatives for the proposed anesthesia with the patient or authorized representative who has indicated his/her understanding and acceptance.     Dental Advisory Given  Plan Discussed with: Anesthesiologist, CRNA and Surgeon  Anesthesia Plan Comments: (Patient consented for risks of anesthesia including but not limited to:  - adverse reactions to medications - damage to eyes, teeth, lips or other oral mucosa - nerve damage due to positioning  - sore throat or hoarseness - Damage to heart, brain, nerves, lungs, other parts of body or loss of life  Patient voiced understanding and assent.)       Anesthesia Quick Evaluation

## 2023-09-29 NOTE — Discharge Instructions (Signed)

## 2023-09-30 ENCOUNTER — Encounter: Payer: Self-pay | Admitting: Ophthalmology

## 2023-09-30 ENCOUNTER — Ambulatory Visit: Payer: Self-pay | Admitting: Anesthesiology

## 2023-09-30 ENCOUNTER — Other Ambulatory Visit: Payer: Self-pay

## 2023-09-30 ENCOUNTER — Ambulatory Visit
Admission: RE | Admit: 2023-09-30 | Discharge: 2023-09-30 | Disposition: A | Discharge status: Home / Self Care | Attending: Ophthalmology | Admitting: Ophthalmology

## 2023-09-30 ENCOUNTER — Encounter
Admission: RE | Disposition: A | Discharge status: Home / Self Care | Payer: Self-pay | Source: Home / Self Care | Attending: Ophthalmology

## 2023-09-30 DIAGNOSIS — J4489 Other specified chronic obstructive pulmonary disease: Secondary | ICD-10-CM | POA: Insufficient documentation

## 2023-09-30 DIAGNOSIS — H2511 Age-related nuclear cataract, right eye: Secondary | ICD-10-CM | POA: Insufficient documentation

## 2023-09-30 DIAGNOSIS — H2589 Other age-related cataract: Secondary | ICD-10-CM | POA: Diagnosis present

## 2023-09-30 DIAGNOSIS — I10 Essential (primary) hypertension: Secondary | ICD-10-CM | POA: Insufficient documentation

## 2023-09-30 DIAGNOSIS — K219 Gastro-esophageal reflux disease without esophagitis: Secondary | ICD-10-CM | POA: Diagnosis not present

## 2023-09-30 DIAGNOSIS — Z87891 Personal history of nicotine dependence: Secondary | ICD-10-CM | POA: Insufficient documentation

## 2023-09-30 HISTORY — DX: Chronic sinusitis, unspecified: J32.9

## 2023-09-30 HISTORY — DX: Unspecified osteoarthritis, unspecified site: M19.90

## 2023-09-30 HISTORY — PX: CATARACT EXTRACTION W/PHACO: SHX586

## 2023-09-30 HISTORY — DX: Other specified postprocedural states: Z98.890

## 2023-09-30 HISTORY — DX: Unspecified asthma, uncomplicated: J45.909

## 2023-09-30 HISTORY — DX: Chronic sinusitis, unspecified: J40

## 2023-09-30 HISTORY — DX: Chronic obstructive pulmonary disease, unspecified: J44.9

## 2023-09-30 SURGERY — PHACOEMULSIFICATION, CATARACT, WITH IOL INSERTION
Anesthesia: Monitor Anesthesia Care | Site: Eye | Laterality: Right

## 2023-09-30 MED ORDER — ARMC OPHTHALMIC DILATING DROPS
OPHTHALMIC | Status: AC
  Filled 2023-09-30: qty 0.5

## 2023-09-30 MED ORDER — TETRACAINE HCL 0.5 % OP SOLN
1.0000 [drp] | OPHTHALMIC | Status: DC | PRN
  Administered 2023-09-30 (×3): 1 [drp] via OPHTHALMIC

## 2023-09-30 MED ORDER — IPRATROPIUM-ALBUTEROL 0.5-2.5 (3) MG/3ML IN SOLN
3.0000 mL | Freq: Once | RESPIRATORY_TRACT | Status: AC
  Administered 2023-09-30: 3 mL via RESPIRATORY_TRACT

## 2023-09-30 MED ORDER — FENTANYL CITRATE (PF) 100 MCG/2ML IJ SOLN
INTRAMUSCULAR | Status: AC
  Filled 2023-09-30: qty 2

## 2023-09-30 MED ORDER — TETRACAINE HCL 0.5 % OP SOLN
OPHTHALMIC | Status: AC
  Filled 2023-09-30: qty 4

## 2023-09-30 MED ORDER — ARMC OPHTHALMIC DILATING DROPS
1.0000 | OPHTHALMIC | Status: DC | PRN
  Administered 2023-09-30 (×3): 1 via OPHTHALMIC

## 2023-09-30 MED ORDER — SIGHTPATH DOSE#1 BSS IO SOLN
INTRAOCULAR | Status: DC | PRN
  Administered 2023-09-30: 15 mL via INTRAOCULAR

## 2023-09-30 MED ORDER — MIDAZOLAM HCL 2 MG/2ML IJ SOLN
INTRAMUSCULAR | Status: AC
  Filled 2023-09-30: qty 2

## 2023-09-30 MED ORDER — CARBACHOL 0.01 % IO SOLN
INTRAOCULAR | Status: DC | PRN
  Administered 2023-09-30: .5 mL via INTRAOCULAR

## 2023-09-30 MED ORDER — ONDANSETRON HCL 4 MG/2ML IJ SOLN: 4.0000 mg | Freq: Once | INTRAMUSCULAR | Status: DC

## 2023-09-30 MED ORDER — MIDAZOLAM HCL 5 MG/5ML IJ SOLN
INTRAMUSCULAR | Status: DC | PRN
  Administered 2023-09-30: 1 mg via INTRAVENOUS

## 2023-09-30 MED ORDER — IPRATROPIUM-ALBUTEROL 0.5-2.5 (3) MG/3ML IN SOLN
RESPIRATORY_TRACT | Status: AC
Start: 2023-09-30 — End: ?
  Filled 2023-09-30: qty 3

## 2023-09-30 MED ORDER — FENTANYL CITRATE (PF) 100 MCG/2ML IJ SOLN
INTRAMUSCULAR | Status: DC | PRN
  Administered 2023-09-30: 50 ug via INTRAVENOUS

## 2023-09-30 MED ORDER — LIDOCAINE HCL (PF) 2 % IJ SOLN
INTRAOCULAR | Status: DC | PRN
  Administered 2023-09-30: 2 mL

## 2023-09-30 MED ORDER — BRIMONIDINE TARTRATE-TIMOLOL 0.2-0.5 % OP SOLN
OPHTHALMIC | Status: DC | PRN
  Administered 2023-09-30: 1 [drp] via OPHTHALMIC

## 2023-09-30 MED ORDER — MOXIFLOXACIN HCL 0.5 % OP SOLN
OPHTHALMIC | Status: DC | PRN
  Administered 2023-09-30: .2 mL via OPHTHALMIC

## 2023-09-30 MED ORDER — SIGHTPATH DOSE#1 BSS IO SOLN
INTRAOCULAR | Status: DC | PRN
  Administered 2023-09-30: 167 mL via OPHTHALMIC

## 2023-09-30 MED ORDER — SIGHTPATH DOSE#1 NA CHONDROIT SULF-NA HYALURON 40-17 MG/ML IO SOLN
INTRAOCULAR | Status: DC | PRN
  Administered 2023-09-30: 1 mL via INTRAOCULAR

## 2023-09-30 SURGICAL SUPPLY — 15 items
CATARACT SUITE SIGHTPATH (MISCELLANEOUS) ×1 IMPLANT
CYSTOTOME ANGL RVRS SHRT 25G (CUTTER) ×1 IMPLANT
CYSTOTOME ANGL RVRS SHRT 25GA (CUTTER) ×1 IMPLANT
FEE CATARACT SUITE SIGHTPATH (MISCELLANEOUS) ×1 IMPLANT
GLOVE BIOGEL PI IND STRL 8 (GLOVE) ×1 IMPLANT
GLOVE SURG LX STRL 8.0 MICRO (GLOVE) ×1 IMPLANT
GLOVE SURG PROTEXIS BL SZ6.5 (GLOVE) ×1 IMPLANT
GLOVE SURG SYN 6.5 PF PI BL (GLOVE) ×1 IMPLANT
LENS CLAREON WAGON WHEEL 22.5 (Intraocular Lens) ×1 IMPLANT
LENS IOL CLRN WGN WHL 22.5 (Intraocular Lens) IMPLANT
NDL FILTER BLUNT 18X1 1/2 (NEEDLE) ×1 IMPLANT
NEEDLE FILTER BLUNT 18X1 1/2 (NEEDLE) ×1 IMPLANT
RING MALYGIN (MISCELLANEOUS) IMPLANT
SOLUTION BAL SALT 15ML (MISCELLANEOUS) IMPLANT
SYR 3ML LL SCALE MARK (SYRINGE) ×1 IMPLANT

## 2023-09-30 NOTE — Transfer of Care (Signed)
 Immediate Anesthesia Transfer of Care Note  Patient: Sydney Day  D Odilia Bennett  Procedure(s) Performed: PHACOEMULSIFICATION, CATARACT, WITH IOL INSERTION 75.73 05:35.9 (Right: Eye)  Patient Location: PACU  Anesthesia Type: MAC  Level of Consciousness: awake, alert  and patient cooperative  Airway and Oxygen Therapy: Patient Spontanous Breathing and Patient connected to supplemental oxygen  Post-op Assessment: Post-op Vital signs reviewed, Patient's Cardiovascular Status Stable, Respiratory Function Stable, Patent Airway and No signs of Nausea or vomiting  Post-op Vital Signs: Reviewed and stable  Complications: No notable events documented.

## 2023-09-30 NOTE — Anesthesia Postprocedure Evaluation (Signed)
 Anesthesia Post Note  Patient: Makinzie  D Veazie  Procedure(s) Performed: PHACOEMULSIFICATION, CATARACT, WITH IOL INSERTION 75.73 05:35.9 (Right: Eye)  Patient location during evaluation: PACU Anesthesia Type: MAC Level of consciousness: awake and alert Pain management: pain level controlled Vital Signs Assessment: post-procedure vital signs reviewed and stable Respiratory status: spontaneous breathing, nonlabored ventilation, respiratory function stable and patient connected to nasal cannula oxygen Cardiovascular status: stable and blood pressure returned to baseline Postop Assessment: no apparent nausea or vomiting Anesthetic complications: no   No notable events documented.   Last Vitals:  Vitals:   09/30/23 1341 09/30/23 1347  BP: (!) 156/87 (!) 150/84  Pulse: 87 81  Resp: 15 17  Temp: (!) 36.4 C (!) 36.4 C  SpO2: 98% 96%    Last Pain:  Vitals:   09/30/23 1347  TempSrc:   PainSc: 0-No pain                 Laurier Jasperson C Adora Yeh

## 2023-09-30 NOTE — H&P (Signed)
 [facility]   Primary Care Physician:  Sydney Day, [place] Ophthalmologist: Dr Merrell Abate  Pre-Procedure History & Physical: HPI:  Sydney Day is a 78 y.o. female here for cataract surgery.   Past Medical History:  Diagnosis Date   Arthritis    Asthma    Asthmatic bronchitis    Asthmatic bronchitis    Chickenpox    Chronic sinusitis with recurrent bronchitis    COPD (chronic obstructive pulmonary disease) (HCC)    Dysrhythmia    GERD (gastroesophageal reflux disease)    HOH (hard of hearing)    Hyperlipidemia    Hypertension    Palpitations    PONV (postoperative nausea and vomiting)    UTI (lower urinary tract infection)     Past Surgical History:  Procedure Laterality Date   CATARACT EXTRACTION W/PHACO Left 07/17/2016   Procedure: CATARACT EXTRACTION PHACO AND INTRAOCULAR LENS PLACEMENT (IOC);  Surgeon: Steven Dingeldein, MD;  Location: [facility] ORS;  Service: Ophthalmology;  Laterality: Left;  US  01:18 AP% 24.4 CDE 36.89 Fluid pack lot # [id number]   CESAREAN SECTION      Prior to Admission medications   Medication Sig Start Date End Date Taking? Authorizing Provider  albuterol  (VENTOLIN  HFA) 108 (90 Base) MCG/ACT inhaler Inhale 2 puffs into the lungs every 6 (six) hours as needed for wheezing or shortness of breath. 09/04/23  Yes Marc Senior, MD  aspirin 81 MG tablet Take 81 mg by mouth daily.   Yes [provider]  Coenzyme Q10 (CO Q-10) 200 MG CAPS Take 1 tablet by mouth daily in the afternoon.   Yes [provider]  hydrochlorothiazide  (MICROZIDE ) 12.5 MG capsule Take 1 capsule (12.5 mg total) by mouth daily. 01/08/18  Yes Kent Pear, MD  simvastatin  (ZOCOR ) 20 MG tablet Take 10 mg by mouth daily.   Yes [provider]  Vitamin D , Cholecalciferol, 25 MCG (1000 UT) TABS Take 1 tablet by mouth daily in the afternoon.   Yes [provider]  Multiple Vitamins-Minerals (MULTIVITAMIN GUMMIES ADULT PO) Take by  mouth. Patient not taking: Reported on 09/16/2023    [provider]    Allergies as of 09/12/2023 - Review Complete 09/04/2023  Allergen Reaction Noted   Codeine  12/09/2011   Rosuvastatin  Other (See Comments) 07/04/2017    Family History  Problem Relation Age of Onset   Heart failure Mother    Hyperlipidemia Mother    Kidney disease Mother    Heart attack Father 25   Heart attack Paternal Grandfather    Hyperlipidemia Sister    Arthritis Unknown    Breast cancer Unknown    Hypertension Unknown    Hyperlipidemia Unknown    Dementia Maternal Grandfather    Kidney disease Son     Social History   Socioeconomic History   Marital status: Married    Spouse name: Not on file   Number of children: Not on file   Years of education: Not on file   Highest education level: Not on file  Occupational History   Not on file  Tobacco Use   Smoking status: Former    Current packs/day: 0.00    Average packs/day: 1 pack/day for 20.0 years (20.0 ttl pk-yrs)    Types: Cigarettes    Start date: 06/19/1966    Quit date: 06/19/1986    Years since quitting: 37.3   Smokeless tobacco: Never  Substance and Sexual Activity   Alcohol use: No   Drug use: No  Sexual activity: Never  Other Topics Concern   Not on file  Social History Narrative   Not on file   Social Drivers of Health   Financial Resource Strain: Not on file  Food Insecurity: Not on file  Transportation Needs: Not on file  Physical Activity: Not on file  Stress: Not on file  Social Connections: Not on file  Intimate Partner Violence: Not on file    Review of Systems: See HPI, otherwise negative ROS  Physical Exam: BP (!) 158/89   Pulse 89   Temp 97.9 F (36.6 C) (Temporal)   Resp 18   Ht 4\' 10"  (1.473 m)   Wt 54.2 kg   SpO2 99%   BMI 24.98 kg/m  General:   Alert, cooperative. Head:  Normocephalic and atraumatic. Respiratory:  Normal work of breathing. Cardiovascular:   NAD  Impression/Plan: Sydney Day is here for cataract surgery.  Risks, benefits, limitations, and alternatives regarding cataract surgery have been reviewed with the patient.  Questions have been answered.  All parties agreeable.   [name], MD  09/30/2023, 12:48 PM

## 2023-09-30 NOTE — Op Note (Signed)
 PREOPERATIVE DIAGNOSIS:  Nuclear sclerotic cataract of the right eye.   POSTOPERATIVE DIAGNOSIS:  Right Eye Cataract   OPERATIVE PROCEDURE:ORPROCALL@   SURGEON:  [name], MD.   ANESTHESIA:  Anesthesiologist: Emilie Harden, MD CRNA: Bill Budd, CRNA  1.      Managed anesthesia care. 2.      0.54ml of Shugarcaine was instilled in the eye following the paracentesis.   COMPLICATIONS: Viscoelastic was used to raise the pupil margin.  A  Malyugin ring was placed as the pupil would not achieve sufficient pharmacologic dilation to undergo cataract extraction safely.( The ring was removed atraumatically following insertion of the IOL.)    TECHNIQUE:   Stop and chop   DESCRIPTION OF PROCEDURE:  The patient was examined and consented in the preoperative holding area where the aforementioned topical anesthesia was applied to the right eye and then brought back to the Operating Room where the right eye was prepped and draped in the usual sterile ophthalmic fashion and a lid speculum was placed. A paracentesis was created with the side port blade and the anterior chamber was filled with viscoelastic. A near clear corneal incision was performed with the steel keratome. A continuous curvilinear capsulorrhexis was performed with a cystotome followed by the capsulorrhexis forceps. Hydrodissection and hydrodelineation were carried out with BSS on a blunt cannula. The lens was removed in a stop and chop  technique and the remaining cortical material was removed with the irrigation-aspiration handpiece. The capsular bag was inflated with viscoelastic and the Technis [id number]  lens was placed in the capsular bag without complication. The remaining viscoelastic was removed from the eye with the irrigation-aspiration handpiece. The wounds were hydrated. The anterior chamber was flushed with BSS and the eye was inflated to physiologic pressure. 0.28ml of Vigamox  was placed in the anterior chamber. The wounds  were found to be water tight. The eye was dressed with Combigan . The patient was given protective glasses to wear throughout the day and a shield with which to sleep tonight. The patient was also given drops with which to begin a drop regimen today and will follow-up with me in one day. Implant Name Type Inv. Item Serial No. Manufacturer Lot No. LRB No. Used Action  LENS CL[employer] WAGON WHEEL 22.5 - [id number] Intraocular Lens LENS [employer] WAGON WHEEL 22.5 [id number] [employer]  Right 1 Implanted   Procedure(s): PHACOEMULSIFICATION, CATARACT, WITH IOL INSERTION 75.73 05:35.9 (Right)  Electronically signed: [name] 09/30/2023 1:40 PM

## 2023-11-06 ENCOUNTER — Ambulatory Visit: Admitting: Pulmonary Disease

## 2023-11-14 ENCOUNTER — Ambulatory Visit

## 2023-11-14 VITALS — HR 82 | Ht <= 58 in | Wt 125.0 lb

## 2023-11-14 DIAGNOSIS — R0602 Shortness of breath: Secondary | ICD-10-CM | POA: Diagnosis not present

## 2023-11-14 DIAGNOSIS — J45909 Unspecified asthma, uncomplicated: Secondary | ICD-10-CM

## 2023-11-14 DIAGNOSIS — J329 Chronic sinusitis, unspecified: Secondary | ICD-10-CM

## 2023-11-14 LAB — PULMONARY FUNCTION TEST
DL/VA % pred: 116 %
DL/VA: 5.06 ml/min/mmHg/L
DLCO unc % pred: 100 %
DLCO unc: 15.26 ml/min/mmHg
FEF 25-75 Post: 1.16 L/s
FEF 25-75 Pre: 0.61 L/s
FEF2575-%Change-Post: 90 %
FEF2575-%Pred-Post: 94 %
FEF2575-%Pred-Pre: 49 %
FEV1-%Change-Post: 15 %
FEV1-%Pred-Post: 90 %
FEV1-%Pred-Pre: 78 %
FEV1-Post: 1.35 L
FEV1-Pre: 1.18 L
FEV1FVC-%Change-Post: 7 %
FEV1FVC-%Pred-Pre: 91 %
FEV6-%Change-Post: 8 %
FEV6-%Pred-Post: 95 %
FEV6-%Pred-Pre: 88 %
FEV6-Post: 1.84 L
FEV6-Pre: 1.7 L
FEV6FVC-%Change-Post: 0 %
FEV6FVC-%Pred-Post: 105 %
FEV6FVC-%Pred-Pre: 104 %
FVC-%Change-Post: 7 %
FVC-%Pred-Post: 90 %
FVC-%Pred-Pre: 84 %
FVC-Post: 1.85 L
Post FEV1/FVC ratio: 73 %
Post FEV6/FVC ratio: 99 %
Pre FEV1/FVC ratio: 68 %
Pre FEV6/FVC Ratio: 99 %
RV % pred: 104 %
RV: 2.13 L
TLC % pred: 93 %
TLC: 3.87 L

## 2023-11-14 NOTE — Progress Notes (Signed)
 Full PFT completed today ? ?

## 2023-11-14 NOTE — Patient Instructions (Signed)
 Full PFT completed today ? ?

## 2023-11-14 NOTE — Addendum Note (Signed)
 Addended by: Glennys Schorsch J on: 11/14/2023 12:49 PM   Modules accepted: Orders

## 2023-11-17 ENCOUNTER — Ambulatory Visit: Admitting: Pulmonary Disease

## 2023-11-17 VITALS — BP 100/70 | HR 78 | Temp 98.8°F | Ht 58.5 in | Wt 123.2 lb

## 2023-11-17 DIAGNOSIS — J40 Bronchitis, not specified as acute or chronic: Secondary | ICD-10-CM | POA: Diagnosis not present

## 2023-11-17 DIAGNOSIS — J4489 Other specified chronic obstructive pulmonary disease: Secondary | ICD-10-CM | POA: Diagnosis not present

## 2023-11-17 DIAGNOSIS — J329 Chronic sinusitis, unspecified: Secondary | ICD-10-CM

## 2023-11-17 DIAGNOSIS — J4540 Moderate persistent asthma, uncomplicated: Secondary | ICD-10-CM

## 2023-11-17 LAB — NITRIC OXIDE: Nitric Oxide: 41

## 2023-11-17 MED ORDER — TRELEGY ELLIPTA 200-62.5-25 MCG/ACT IN AEPB: 1.0000 | INHALATION_SPRAY | Freq: Every day | RESPIRATORY_TRACT | Status: DC

## 2023-11-17 NOTE — Progress Notes (Signed)
 Subjective:    Patient ID: Sydney Day  Sydney Day, female    DOB: Feb 02, 1946, 78 y.o.   MRN: [id number]  Patient Care Team: Aneita Baptise, [place] as PCP - General (Family Medicine) Marc Senior, MD as Consulting Physician (Pulmonary Disease)  Chief Complaint  Patient presents with   Follow-up    2 month follow up, x-ray: 3/27, PFT: 6/6, feno: 27 last visit  Breathing has improved since last visit, very occasional small cough, no SOB/DOE, and wheezing. No flare up from Bronchitis.    BACKGROUND/INTERVAL:Patient is a 78 year old remote former smoker who presents for follow-up of recurrent bouts of bronchitis, cough shortness of breath and wheezing. She is self-referred. Her primary care practitioner is Aneita Baptise, PA-C at the [facility].  She was initially evaluated here on 04 September 2023.  This is a scheduled follow-up visit.  HPI Discussed the use of AI scribe software for clinical note transcription with the patient, who gave verbal consent to proceed.  History of Present Illness   Sydney  D Reinette Day is a 78 year old female with recurrent bronchitis who presents for follow-up.  She feels generally well but is experiencing incomplete vision recovery five weeks post-cataract surgery, necessitating prolonged use of eye drops.  She has a history of chronic bronchitis and has been diagnosed with a combination of COPD and asthma. She notes that she has never been told she had asthma before, only chronic bronchitis. A recent chest x-ray from March showed mild COPD, and a breathing test indicated moderate airway inflammation, which improved significantly with the use of an inhaler.  She has a history of smoking for five years and has advised her grandson against smoking, emphasizing the impact it had on her health. She currently has an albuterol  rescue inhaler, which she has used only once or twice, and is managing her medications through [facility] due to insurance  coverage issues with [employer].     She underwent PFTs on 14 November 2023 showing FEV1 1.18 L or 78% predicted FVC of 1.72 L or 84% predicted FEV1/FVC of 68% predicted, there is significant bronchodilator response with FEV1 normalizing after bronchodilator.  Lung volumes are normal diffusion capacity normal.  The findings are more consistent with asthma than with COPD.  There is a moderate obstructive airways defect with significant bronchodilator response.  Study was discussed in detail with the patient.  Stressed the need for maintenance inhaler.  Review of Systems A 10 point review of systems was performed and it is as noted above otherwise negative.   Patient Active Problem List   Diagnosis Date Noted   Foot injury 01/01/2018   Osteoporosis 09/17/2017   Bilateral knee pain 01/01/2017   Overweight (BMI 25.0-29.9) 09/26/2016   Caregiver burden 09/26/2016   Dysfunction of left eustachian tube 09/28/2015   Palpitations 08/28/2015   Essential hypertension 07/31/2015   Stress incontinence 07/31/2015   Chest pain 12/09/2011   Hyperlipidemia     Social History   Tobacco Use   Smoking status: Former    Current packs/day: 0.00    Average packs/day: 1 pack/day for 20.0 years (20.0 ttl pk-yrs)    Types: Cigarettes    Start date: 06/19/1966    Quit date: 06/19/1986    Years since quitting: 37.4   Smokeless tobacco: Never  Substance Use Topics   Alcohol use: No    Allergies  Allergen Reactions   Codeine     nausea   Rosuvastatin  Other (See Comments)    Muscle  aches    Current Meds  Medication Sig   albuterol  (VENTOLIN  HFA) 108 (90 Base) MCG/ACT inhaler Inhale 2 puffs into the lungs every 6 (six) hours as needed for wheezing or shortness of breath.   aspirin 81 MG tablet Take 81 mg by mouth daily.   Coenzyme Q10 (CO Q-10) 200 MG CAPS Take 1 tablet by mouth daily in the afternoon.   DUREZOL 0.05 % EMUL Place 1 drop into the right eye 4 (four) times daily.   Fluticasone -Umeclidin-Vilant  (TRELEGY ELLIPTA ) 200-62.5-25 MCG/ACT AEPB Inhale 1 puff into the lungs daily.   hydrochlorothiazide  (HYDRODIURIL ) 25 MG tablet Take 25 mg by mouth daily.   omeprazole (PRILOSEC) 20 MG capsule Take 20 mg by mouth daily.   simvastatin  (ZOCOR ) 10 MG tablet Take 10 mg by mouth daily.   Vitamin D , Cholecalciferol, 25 MCG (1000 UT) TABS Take 1 tablet by mouth daily in the afternoon.   Current Facility-Administered Medications for the 11/17/23 encounter (Office Visit) with Marc Senior, MD  Medication   denosumab  (PROLIA ) injection 60 mg    Immunization History  Administered Date(s) Administered   Influenza-Unspecified 03/27/2017, 04/10/2018, 04/11/2023   Pneumococcal Conjugate-13 07/31/2015   Pneumococcal Polysaccharide-23 12/18/2016   Tdap 12/02/2017        Objective:     BP 100/70 (BP Location: Right Arm, Patient Position: Sitting, Cuff Size: Normal)   Pulse 78   Temp 98.8 F (37.1 C) (Oral)   Ht 4' 10.5 (1.486 m)   Wt 123 lb 3.2 oz (55.9 kg)   SpO2 95%   BMI 25.31 kg/m   SpO2: 95 %  GENERAL: Well-developed, well-nourished, short statured elderly woman, no acute distress.  Fully ambulatory, no conversational dyspnea. HEAD: Normocephalic, atraumatic.  EYES: Pupils equal, round, reactive to light.  No scleral icterus.  MOUTH: Poor dentition, oral mucosa moist.  No thrush.  NECK: Supple. No thyromegaly. Trachea midline. No JVD.  No adenopathy. CHEST: Significant kyphosis.  Creased AP diameter. PULMONARY: Good air entry bilaterally.  Rare end expiratory wheeze. CARDIOVASCULAR: S1 and S2. Regular rate and rhythm.  No rubs, murmurs or gallops heard. ABDOMEN: Benign. MUSCULOSKELETAL: No joint deformity, no clubbing, no edema.  NEUROLOGIC: No overt focal deficit, no gait disturbance, speech is fluent. SKIN: Intact,warm,dry. PSYCH: Mood and behavior normal   Lab Results  Component Value Date   NITRICOXIDE 41 11/17/2023  *There is evidence of type II inflammation  present.       Assessment & Plan:     ICD-10-CM   1. Moderate persistent asthma without complication  J45.40 Nitric oxide     Fluticasone -Umeclidin-Vilant (TRELEGY ELLIPTA ) 200-62.5-25 MCG/ACT AEPB    2. Asthmatic bronchitis , chronic (HCC)  J44.89 Nitric oxide     Fluticasone -Umeclidin-Vilant (TRELEGY ELLIPTA ) 200-62.5-25 MCG/ACT AEPB    3. Chronic sinusitis with recurrent bronchitis  J32.9 Nitric oxide    J40 Fluticasone -Umeclidin-Vilant (TRELEGY ELLIPTA ) 200-62.5-25 MCG/ACT AEPB      Orders Placed This Encounter  Procedures   Nitric oxide     Meds ordered this encounter  Medications   Fluticasone -Umeclidin-Vilant (TRELEGY ELLIPTA ) 200-62.5-25 MCG/ACT AEPB    Sig: Inhale 1 puff into the lungs daily.   Discussion:    Asthma Asthma with significant airway inflammation. Pulmonary function test shows moderate obstruction with improvement to normal after bronchodilator, indicating asthma as the primary issue. Excellent oxygen carrying capacity. Recurrent bronchitis likely related to asthma. - Prescribe maintenance inhaler, one puff daily. Rinse mouth well after use. - Provide sample inhaler for one month. - Evaluate response  to medication before sending prescription to preferred pharmacy. - Use rescue inhaler as needed for dyspnea or chest tightness.  Chronic Obstructive Pulmonary Disease (COPD) -minimal component Mild COPD on chest x-ray from March.  This was likely mostly due to hyperinflation from air trapping.  Pulmonary function test indicates minimal COPD contribution compared to asthma.  Asthma is main component.  Cataract Recent cataract surgery with suboptimal outcome. Ongoing use of eye drops. Vision not fully restored to pre-surgery level.     Advised if symptoms do not improve or worsen, to please contact office for sooner follow up or seek emergency care.    I spent 45 minutes of dedicated to the care of this patient on the date of this encounter to include  pre-visit review of records, face-to-face time with the patient discussing conditions above, post visit ordering of testing, clinical documentation with the electronic health record, making appropriate referrals as documented, and communicating necessary findings to members of the patients care team.     C. Chloe Counter, MD Advanced Bronchoscopy PCCM [facility]    *This note was generated using voice recognition software/Dragon and/or AI transcription program.  Despite best efforts to proofread, errors can occur which can change the meaning. Any transcriptional errors that result from this process are unintentional and may not be fully corrected at the time of dictation.

## 2023-11-17 NOTE — Patient Instructions (Signed)
 VISIT SUMMARY:  You came in today for a follow-up appointment. You are feeling generally well but are experiencing incomplete vision recovery five weeks after your cataract surgery. We discussed your history of chronic bronchitis, COPD, and asthma, and reviewed your recent chest x-ray and breathing test results.  YOUR PLAN:  -ASTHMA: Asthma is a condition where your airways become inflamed and narrow, making it hard to breathe. Your breathing test showed moderate obstruction that improved with an inhaler, indicating asthma as the main issue. You will start using a maintenance inhaler, Trelegy, one puff daily, and rinse your mouth after use. We provided a sample inhaler for one month and will evaluate your response before sending a prescription to your preferred pharmacy. Use your rescue inhaler as needed for shortness of breath or chest tightness.  -CHRONIC OBSTRUCTIVE PULMONARY DISEASE (COPD): COPD is a chronic lung disease that makes it hard to breathe. Your chest x-ray showed mild COPD, but your breathing test indicates that asthma is the primary issue. No changes to your current COPD management were made today.  -CATARACT: A cataract is a clouding of the eye's lens, leading to vision problems. You had cataract surgery recently, but your vision has not fully recovered. Continue using your eye drops as prescribed.  INSTRUCTIONS:  We will evaluate your response to the sample inhaler before sending a prescription to your preferred pharmacy. Use your rescue inhaler as needed. Continue using your eye drops as prescribed. Follow up with us  if you have any concerns or if your symptoms worsen.

## 2023-11-18 ENCOUNTER — Telehealth: Payer: Self-pay

## 2023-11-18 ENCOUNTER — Ambulatory Visit: Payer: Self-pay | Admitting: Pulmonary Disease

## 2023-11-18 ENCOUNTER — Other Ambulatory Visit: Payer: Self-pay | Admitting: Pulmonary Disease

## 2023-11-18 DIAGNOSIS — J4540 Moderate persistent asthma, uncomplicated: Secondary | ICD-10-CM

## 2023-11-18 MED ORDER — FLUTICASONE-SALMETEROL 100-50 MCG/ACT IN AEPB: 1.0000 | INHALATION_SPRAY | Freq: Two times a day (BID) | RESPIRATORY_TRACT | 2 refills | Status: DC

## 2023-11-18 NOTE — Progress Notes (Signed)
Noted, nothing further needed.  

## 2023-11-18 NOTE — Telephone Encounter (Signed)
 Spoke with pt and clarified which pharmacy the new medication can be sent to. Pt specified [facility] and Dr. Viva Grise has sent in new medication.

## 2023-11-18 NOTE — Telephone Encounter (Signed)
 We can try Advair 100/50, 1 inhalation twice a day does she want us  to send it to [facility]?

## 2023-11-18 NOTE — Telephone Encounter (Signed)
 Copied from CRM [id number]. Topic: Clinical - Medication Question >> Nov 18, 2023  9:49 AM Laurina Popper O wrote: Reason for CRM: patient is calling cause doctor gave me samples and i took one yesterday but my heart started to beat fast and i cough like its trying to drain and im kind of scared to take another dose and im wondering about the dosage and i think it shopulkd be a lower dosage and still dont know if i should be taking it at all . Needing to speak with dr or dr nurse about what happen yesterday when I took the sample of trellegy  and with this cough I have this morning .patient is saying she want take another till she speaks with someone about the medication and how she felt  Please give patient a call asap  [phone]

## 2023-11-18 NOTE — Progress Notes (Signed)
 Patient did not tolerate Trelegy.  Will try Advair 100/50,1 inhalation twice a day.  Acey Ace, MD Advanced Bronchoscopy PCCM [facility]

## 2023-11-21 ENCOUNTER — Encounter: Payer: Self-pay | Admitting: Pulmonary Disease

## 2024-01-24 ENCOUNTER — Other Ambulatory Visit: Payer: Self-pay | Admitting: Pulmonary Disease

## 2024-02-04 NOTE — Progress Notes (Unsigned)
  Cardiology Office Note   Date:  02/05/2024  ID:  Sydney Day, Sydney Day 02-15-1946, MRN [id number] PCP: Donnie Handing, PA   [facility] Providers Cardiologist:  Caron Poser, MD     History of Present Illness Sydney  D Day is a 78 y.o. female PMH HTN, HLD, former tobacco use who presents for further evaluation and management of hypertension.  Pt reports some labile BPs. She was recently trialed off of her hydrochlorothiazide  and was consistently above 150s. She went back on the medication and is now consistently 130s-140s. LDL 136 01/2024. Asymptomatic. She notes she has tried 3 different statin medications and has issues with myalgias and tolerance. She can only tolerate low dose simvastatin .  Relevant CVD History -Normal exercise stress MPI SPECT 2013   ROS: Pt denies any chest discomfort, jaw pain, arm pain, syncope, presyncope, orthopnea, PND, or LE edema.  Studies Reviewed I have independently reviewed the patient's ECG, recent bloodwork, and previous cardiac testing.  Physical Exam VS:  BP (!) 136/8 (BP Location: Right Arm, Patient Position: Sitting, Cuff Size: Normal)   Pulse 68   Ht 4' 11 (1.499 m)   Wt 124 lb (56.2 kg)   SpO2 96%   BMI 25.04 kg/m        Wt Readings from Last 3 Encounters:  02/05/24 124 lb (56.2 kg)  11/17/23 123 lb 3.2 oz (55.9 kg)  11/14/23 125 lb (56.7 kg)    GEN: No acute distress. NECK: No JVD; No carotid bruits. CARDIAC: RRR, no murmurs, rubs, gallops. RESPIRATORY:  Clear to auscultation. EXTREMITIES:  Warm and well-perfused. No edema.  ASSESSMENT AND PLAN New LBBB New LBBB present from prior ECG 2013. Asymptomatic. No signs of symptoms of HF.   Plan: -Echo to further evaluate -In absence of symptoms, no further workup indicated  Palpitations Very infrequent, less than monthly. Self-limited and brief. I advised her to let me know if they start happening more often and we can get a monitor. Unlikely we would capture  anything on a 2-week monitor currently given low frequency.  HTN Well-controlled. She is consistently 130s systolic at home. Given her age, I don't think we need strict BP control with her. I think a systolic goal of 869d-859d would be fine. Continue hydrochlorothiazide  25mg  every day for now; if needed, could add a very low dose of norvasc or ARB.  HLD Statin intolerance Last LDL 136 earlier this month. She has tried numerous different statins with myalgias. Discussed adding repatha to her regimen; she would like to think about it and get back to me next visit.          Dispo: RTC 3 months to re-discuss repatha and review echo  Signed, Caron Poser, MD

## 2024-02-05 ENCOUNTER — Ambulatory Visit

## 2024-02-05 VITALS — BP 136/8 | HR 68 | Ht 59.0 in | Wt 124.0 lb

## 2024-02-05 DIAGNOSIS — I10 Essential (primary) hypertension: Secondary | ICD-10-CM

## 2024-02-05 DIAGNOSIS — E782 Mixed hyperlipidemia: Secondary | ICD-10-CM

## 2024-02-05 DIAGNOSIS — I447 Left bundle-branch block, unspecified: Secondary | ICD-10-CM

## 2024-02-05 DIAGNOSIS — R002 Palpitations: Secondary | ICD-10-CM

## 2024-02-05 DIAGNOSIS — Z789 Other specified health status: Secondary | ICD-10-CM | POA: Diagnosis not present

## 2024-02-05 NOTE — Patient Instructions (Signed)
 Medication Instructions:   Your physician recommends that you continue on your current medications as directed. Please refer to the Current Medication list given to you today.   *If you need a refill on your cardiac medications before your next appointment, please call your pharmacy*  Lab Work:  No labs ordered today   If you have labs (blood work) drawn today and your tests are completely normal, you will receive your results only by: MyChart Message (if you have MyChart) OR A paper copy in the mail If you have any lab test that is abnormal or we need to change your treatment, we will call you to review the results.  Testing/Procedures:  Your physician has requested that you have an echocardiogram. Echocardiography is a painless test that uses sound waves to create images of your heart. It provides your doctor with information about the size and shape of your heart and how well your heart's chambers and valves are working.   You may receive an ultrasound enhancing agent through an IV if needed to better visualize your heart during the echo. This procedure takes approximately one hour.  There are no restrictions for this procedure.  This will take place at [address] [facility] [address] [facility]) [address], [place] [address]  Please note: We ask at that you not bring children with you during ultrasound (echo/ vascular) testing. Due to room size and safety concerns, children are not allowed in the ultrasound rooms during exams. Our front office staff cannot provide observation of children in our lobby area while testing is being conducted. An adult accompanying a patient to their appointment will only be allowed in the ultrasound room at the discretion of the ultrasound technician under special circumstances. We apologize for any inconvenience.   Follow-Up: At [facility], you and your health needs are our priority.  As part of our continuing mission to provide you with exceptional  heart care, our providers are all part of one team.  This team includes your primary Cardiologist (physician) and Advanced Practice Providers or APPs (Physician Assistants and Nurse Practitioners) who all work together to provide you with the care you need, when you need it.  Your next appointment:   3 month(s)  Provider:   Caron Poser, MD   We recommend signing up for the patient portal called MyChart.  Sign up information is provided on this After Visit Summary.  MyChart is used to connect with patients for Virtual Visits (Telemedicine).  Patients are able to view lab/test results, encounter notes, upcoming appointments, etc.  Non-urgent messages can be sent to your provider as well.   To learn more about what you can do with MyChart, go to [email or web address].

## 2024-02-19 ENCOUNTER — Encounter: Payer: Self-pay | Admitting: Pulmonary Disease

## 2024-02-19 ENCOUNTER — Other Ambulatory Visit
Admission: RE | Admit: 2024-02-19 | Discharge: 2024-02-19 | Disposition: A | Discharge status: Home / Self Care | Source: Ambulatory Visit | Attending: Pulmonary Disease | Admitting: Pulmonary Disease

## 2024-02-19 ENCOUNTER — Ambulatory Visit: Admitting: Pulmonary Disease

## 2024-02-19 VITALS — BP 136/86 | HR 86 | Temp 97.9°F | Ht 59.0 in | Wt 123.8 lb

## 2024-02-19 DIAGNOSIS — J4540 Moderate persistent asthma, uncomplicated: Secondary | ICD-10-CM | POA: Diagnosis not present

## 2024-02-19 DIAGNOSIS — R0602 Shortness of breath: Secondary | ICD-10-CM

## 2024-02-19 DIAGNOSIS — Z23 Encounter for immunization: Secondary | ICD-10-CM

## 2024-02-19 LAB — NITRIC OXIDE: Nitric Oxide: 28

## 2024-02-19 LAB — CBC WITH DIFFERENTIAL/PLATELET
Abs Immature Granulocytes: 0.04 K/uL (ref 0.00–0.07)
Basophils Absolute: 0.1 K/uL (ref 0.0–0.1)
Basophils Relative: 1 %
Eosinophils Absolute: 0.2 K/uL (ref 0.0–0.5)
Eosinophils Relative: 3 %
HCT: 44.6 % (ref 36.0–46.0)
Hemoglobin: 14.5 g/dL (ref 12.0–15.0)
Immature Granulocytes: 1 %
Lymphocytes Relative: 29 %
Lymphs Abs: 2.1 K/uL (ref 0.7–4.0)
MCH: 27.8 pg (ref 26.0–34.0)
MCHC: 32.5 g/dL (ref 30.0–36.0)
MCV: 85.6 fL (ref 80.0–100.0)
Monocytes Absolute: 0.5 K/uL (ref 0.1–1.0)
Monocytes Relative: 7 %
Neutro Abs: 4.3 K/uL (ref 1.7–7.7)
Neutrophils Relative %: 59 %
Platelets: 336 K/uL (ref 150–400)
RBC: 5.21 MIL/uL — ABNORMAL HIGH (ref 3.87–5.11)
RDW: 13.4 % (ref 11.5–15.5)
WBC: 7.2 K/uL (ref 4.0–10.5)
nRBC: 0 % (ref 0.0–0.2)

## 2024-02-19 MED ORDER — FLUTICASONE-SALMETEROL 250-50 MCG/ACT IN AEPB: 1.0000 | INHALATION_SPRAY | Freq: Two times a day (BID) | RESPIRATORY_TRACT | 11 refills | Status: AC

## 2024-02-19 MED ORDER — ALBUTEROL SULFATE HFA 108 (90 BASE) MCG/ACT IN AERS: 2.0000 | INHALATION_SPRAY | Freq: Four times a day (QID) | RESPIRATORY_TRACT | 2 refills | Status: AC | PRN

## 2024-02-19 NOTE — Patient Instructions (Signed)
 VISIT SUMMARY:  You had a follow-up appointment to discuss your asthma management. Your symptoms have shown some improvement, but there are still areas that need attention, such as consistent use of your inhaler. We also discussed your insurance options and the importance of rinsing your mouth after using your inhaler.  YOUR PLAN:  -ASTHMA: Asthma is a condition where your airways become inflamed and narrow, making it hard to breathe. Your asthma is improving but is not yet fully controlled. Your lung function has improved from 78% to 90% after using your inhaler. We will increase the strength of your anti-inflammatory inhaler and ensure you always have a rescue inhaler available. Remember to rinse your mouth after using your inhaler to prevent any oral complications. We will also check your blood for allergies and administer a flu shot.  INSTRUCTIONS:  Please make sure to use your inhaler consistently as prescribed. Follow up with the blood work to check for allergies and get your flu shot as discussed. If you have any issues with your insurance or medication, please let us  know.

## 2024-02-19 NOTE — Progress Notes (Signed)
 Subjective:    Patient ID: Sydney  GRETEL Day, female    DOB: 1945-08-29, 78 y.o.   MRN: [id number]  Patient Care Team: Donnie Handing, [place] as PCP - General (Family Medicine) [place] Clap, MD as PCP - Cardiology (Cardiology) Tamea Dedra CROME, MD as Consulting Physician (Pulmonary Disease)  Chief Complaint  Patient presents with   Asthma    Occasional shortness of breath on exertion.     BACKGROUND/INTERVAL:Patient is a 78 year old remote former smoker who presents for follow-up of recurrent bouts of bronchitis, cough shortness of breath and wheezing. She was initially evaluated here on 04 September 2023.  Last visit here was on 17 November 2023.  This is a scheduled follow-up visit.   HPI  DATA 08/15/2023 nitric oxide : 27 ppb 11/14/2023 PFTs:FEV1 1.18 L or 78% predicted FVC of 1.72 L or 84% predicted FEV1/FVC of 68% predicted, there is significant bronchodilator response with FEV1 normalizing after bronchodilator. Lung volumes are normal diffusion capacity normal. The findings are more consistent with asthma than with COPD. There is a moderate obstructive airways defect with significant bronchodilator response.  11/17/2023 nitric oxide : 41 ppb  Review of Systems A 10 point review of systems was performed and it is as noted above otherwise negative.   Patient Active Problem List   Diagnosis Date Noted   Foot injury 01/01/2018   Osteoporosis 09/17/2017   Bilateral knee pain 01/01/2017   Overweight (BMI 25.0-29.9) 09/26/2016   Caregiver burden 09/26/2016   Dysfunction of left eustachian tube 09/28/2015   Palpitations 08/28/2015   Essential hypertension 07/31/2015   Stress incontinence 07/31/2015   Chest pain 12/09/2011   Hyperlipidemia     Social History   Tobacco Use   Smoking status: Former    Current packs/day: 0.00    Average packs/day: 1 pack/day for 20.0 years (20.0 ttl pk-yrs)    Types: Cigarettes    Start date: 06/19/1966    Quit date: 06/19/1986    Years since  quitting: 37.6   Smokeless tobacco: Never  Substance Use Topics   Alcohol use: No    Allergies  Allergen Reactions   Codeine     nausea   Rosuvastatin  Other (See Comments)    Muscle aches    Current Meds  Medication Sig   acetaminophen (TYLENOL) 325 MG tablet Take 325 mg by mouth every 6 (six) hours as needed.   aspirin 81 MG tablet Take 81 mg by mouth daily.   Coenzyme Q10 (CO Q-10) 200 MG CAPS Take 1 tablet by mouth daily in the afternoon.   DUREZOL 0.05 % EMUL Place 1 drop into the right eye 4 (four) times daily.   fluticasone -salmeterol (ADVAIR) 250-50 MCG/ACT AEPB Inhale 1 puff into the lungs in the morning and at bedtime.   hydrochlorothiazide  (HYDRODIURIL ) 25 MG tablet Take 25 mg by mouth daily.   omeprazole (PRILOSEC) 20 MG capsule Take 20 mg by mouth daily.   simvastatin  (ZOCOR ) 10 MG tablet Take 10 mg by mouth daily.   Vitamin D , Cholecalciferol, 25 MCG (1000 UT) TABS Take 1 tablet by mouth daily in the afternoon.   [DISCONTINUED] albuterol  (VENTOLIN  HFA) 108 (90 Base) MCG/ACT inhaler Inhale 2 puffs into the lungs every 6 (six) hours as needed for wheezing or shortness of breath.   [DISCONTINUED] WIXELA INHUB 100-50 MCG/ACT AEPB INHALE 1 PUFF INTO THE LUNGS TWICE DAILY   Current Facility-Administered Medications for the 02/19/24 encounter (Office Visit) with Tamea Dedra CROME, MD  Medication   denosumab  (PROLIA ) injection 60 mg  Immunization History  Administered Date(s) Administered   Influenza-Unspecified 03/27/2017, 04/10/2018, 04/11/2023   Pneumococcal Conjugate-13 07/31/2015   Pneumococcal Polysaccharide-23 12/18/2016   Tdap 12/02/2017        Objective:     BP 136/86   Pulse 86   Temp 97.9 F (36.6 C) (Temporal)   Ht 4' 11 (1.499 m)   Wt 123 lb 12.8 oz (56.2 kg)   SpO2 97%   BMI 25.00 kg/m   SpO2: 97 %  GENERAL: Well-developed, well-nourished, short statured elderly woman, no acute distress.  Fully ambulatory, no conversational  dyspnea. HEAD: Normocephalic, atraumatic.  EYES: Pupils equal, round, reactive to light.  No scleral icterus.  MOUTH: Poor dentition, oral mucosa moist.  No thrush.  NECK: Supple. No thyromegaly. Trachea midline. No JVD.  No adenopathy. CHEST: Significant kyphosis.  Creased AP diameter. PULMONARY: Good air entry bilaterally.  Rare end expiratory wheeze. CARDIOVASCULAR: S1 and S2. Regular rate and rhythm.  No rubs, murmurs or gallops heard. ABDOMEN: Benign. MUSCULOSKELETAL: No joint deformity, no clubbing, no edema.  NEUROLOGIC: No overt focal deficit, no gait disturbance, speech is fluent. SKIN: Intact,warm,dry. PSYCH: Mood and behavior normal  Lab Results  Component Value Date   NITRICOXIDE 28 02/19/2024  *Intermediate level of type II inflammation present. *Trend: 27>>41>>28 ppb    Assessment & Plan:     ICD-10-CM   1. Moderate persistent asthma without complication  J45.40 CBC with Differential/Platelet    Allergen Panel (27) + IGE    2. Shortness of breath  R06.02 Nitric oxide     3. Immunization due  Z23 Flu vaccine HIGH DOSE PF(Fluzone Trivalent)      Orders Placed This Encounter  Procedures   Flu vaccine HIGH DOSE PF(Fluzone Trivalent)   CBC with Differential/Platelet    Standing Status:   Future    Expected Date:   02/19/2024    Expiration Date:   02/18/2025   Allergen Panel (27) + IGE    Standing Status:   Future    Expiration Date:   02/18/2025   Nitric oxide     Meds ordered this encounter  Medications   albuterol  (VENTOLIN  HFA) 108 (90 Base) MCG/ACT inhaler    Sig: Inhale 2 puffs into the lungs every 6 (six) hours as needed for wheezing or shortness of breath.    Dispense:  8 g    Refill:  2   fluticasone -salmeterol (ADVAIR) 250-50 MCG/ACT AEPB    Sig: Inhale 1 puff into the lungs in the morning and at bedtime.    Dispense:  60 each    Refill:  11   Discussion:    Asthma, unspecified Asthma is improving but remains suboptimally controlled. Airway  inflammation decreased from 41 to 28, though still elevated. Pulmonary function test improved from 78% to 90% post-inhaler use, consistent with asthma diagnosis. Current inhaler regimen is not fully adhered to, and insurance constraints dictate the choice of inhaler. - Increase the strength of the anti-inflammatory inhaler (Advair 250/50 1 inhalation twice a day). - Ensure the rescue inhaler is available at all times. - Instruct to rinse mouth after inhaler use to prevent oral complications. - Order blood work to check for allergies. - Administer flu shot.     Will see the patient in follow-up in 3 months time she is to contact us  prior to that time should any new difficulties arise.  Advised if symptoms do not improve or worsen, to please contact office for sooner follow up or seek emergency care.  I spent 32 minutes of dedicated to the care of this patient on the date of this encounter to include pre-visit review of records, face-to-face time with the patient discussing conditions above, post visit ordering of testing, clinical documentation with the electronic health record, making appropriate referrals as documented, and communicating necessary findings to members of the patients care team.     C. Leita Sanders, MD Advanced Bronchoscopy PCCM [facility]    *This note was generated using voice recognition software/Dragon and/or AI transcription program.  Despite best efforts to proofread, errors can occur which can change the meaning. Any transcriptional errors that result from this process are unintentional and may not be fully corrected at the time of dictation.

## 2024-02-21 ENCOUNTER — Ambulatory Visit: Payer: Self-pay | Admitting: Pulmonary Disease

## 2024-02-21 LAB — ALLERGEN PANEL (27) + IGE
Alternaria Alternata IgE: <0.1 kU/L
Aspergillus Fumigatus IgE: <0.1 kU/L
Bahia Grass IgE: <0.1 kU/L
Bermuda Grass IgE: <0.1 kU/L
Cat Dander IgE: <0.1 kU/L
Cedar, Mountain IgE: <0.1 kU/L
Cladosporium Herbarum IgE: <0.1 kU/L
Cocklebur IgE: <0.1 kU/L
Cockroach, American IgE: <0.1 kU/L
Common Silver Birch IgE: <0.1 kU/L
D Farinae IgE: <0.1 kU/L
D Pteronyssinus IgE: <0.1 kU/L
Dog Dander IgE: <0.1 kU/L
Elm, American IgE: <0.1 kU/L
Hickory, White IgE: <0.1 kU/L
IgE (Immunoglobulin E), Serum: 38 [IU]/mL (ref 6–495)
Johnson Grass IgE: <0.1 kU/L
Kentucky Bluegrass IgE: <0.1 kU/L
Maple/Box Elder IgE: <0.1 kU/L
Mucor Racemosus IgE: <0.1 kU/L
Oak, White IgE: <0.1 kU/L
Penicillium Chrysogen IgE: <0.1 kU/L
Pigweed, Rough IgE: <0.1 kU/L
Plantain, English IgE: <0.1 kU/L
Ragweed, Short IgE: <0.1 kU/L
Setomelanomma Rostrat: <0.1 kU/L
Timothy Grass IgE: <0.1 kU/L
White Mulberry IgE: <0.1 kU/L

## 2024-03-06 ENCOUNTER — Encounter: Payer: Self-pay | Admitting: Pulmonary Disease

## 2024-03-24 ENCOUNTER — Ambulatory Visit

## 2024-03-24 DIAGNOSIS — I447 Left bundle-branch block, unspecified: Secondary | ICD-10-CM | POA: Diagnosis present

## 2024-03-24 LAB — ECHOCARDIOGRAM COMPLETE
AR max vel: 2.37 cm2
AV Area VTI: 2.13 cm2
AV Area mean vel: 2.3 cm2
AV Mean grad: 3 mmHg
AV Peak grad: 5.9 mmHg
Ao pk vel: 1.21 m/s
Area-P 1/2: 3.85 cm2
S' Lateral: 3.03 cm

## 2024-03-25 ENCOUNTER — Telehealth: Payer: Self-pay

## 2024-03-25 ENCOUNTER — Other Ambulatory Visit: Payer: Self-pay | Admitting: *Deleted

## 2024-03-25 ENCOUNTER — Ambulatory Visit: Payer: Self-pay

## 2024-03-25 DIAGNOSIS — R072 Precordial pain: Secondary | ICD-10-CM

## 2024-03-25 DIAGNOSIS — I447 Left bundle-branch block, unspecified: Secondary | ICD-10-CM

## 2024-03-25 DIAGNOSIS — I10 Essential (primary) hypertension: Secondary | ICD-10-CM

## 2024-03-25 DIAGNOSIS — Z79899 Other long term (current) drug therapy: Secondary | ICD-10-CM

## 2024-03-25 MED ORDER — METOPROLOL TARTRATE 50 MG PO TABS: ORAL_TABLET | ORAL | 0 refills | Status: AC

## 2024-03-25 MED ORDER — LOSARTAN POTASSIUM 25 MG PO TABS: 25.0000 mg | ORAL_TABLET | Freq: Every day | ORAL | 3 refills | Status: DC

## 2024-03-25 MED ORDER — METOPROLOL SUCCINATE ER 25 MG PO TB24: 25.0000 mg | ORAL_TABLET | Freq: Every day | ORAL | 3 refills | Status: DC

## 2024-03-25 NOTE — Telephone Encounter (Signed)
 Pt c/o medication issue:  1. Name of Medication:  losartan (COZAAR) 25 MG tablet metoprolol succinate (TOPROL-XL) 25 MG 24 hr tablet  2. How are you currently taking this medication (dosage and times per day)?   3. Are you having a reaction (difficulty breathing--STAT)?   4. What is your medication issue?   Patient says when she saw Dr. Argentina she forgot to tell him that she cannot swallow pills. She would like to know if these medications can be chewed or crushed and put in food. Please advise.

## 2024-03-25 NOTE — Telephone Encounter (Signed)
 Called and spoke with the patient.  Patient wants to know if she can crush her pills.  This RN informed the patient that Metoprolol Succinate (Toprol-XL) is an extended release tablet that we recommend not be crushed.  Normally it is ok for her crush Losartan.  But I will defer to Dr. Argentina for to make the final determination about what she can do as it pertains to crushing her medications.  Informed that we will contact her as soon as we hear back from Dr. Argentina.  Patient verbalized understanding and expressed gratitude for the call back.

## 2024-03-26 ENCOUNTER — Other Ambulatory Visit: Payer: Self-pay

## 2024-03-26 ENCOUNTER — Other Ambulatory Visit: Payer: Self-pay | Admitting: *Deleted

## 2024-03-26 NOTE — Telephone Encounter (Signed)
 Spoke with pt; she apologized for not telling Dr. Argentina that she cannot swallow pills and that she always either crushes or chews them; I reassured her that it was not a problem and that we switched her medications from Metoprolol Succinate XL to Metoprolol Tartrate (Lopressor) 12.5 mg by mouth twice daily and explained that the medication will come in 25 mg tablets, but she is to cut and take 1/2 tablet and it is able to be crushed.  She thanked us  for being so understand.  I placed a note in her chart so that it is known that she cannot swallow pills.

## 2024-04-15 ENCOUNTER — Ambulatory Visit

## 2024-04-28 ENCOUNTER — Other Ambulatory Visit: Payer: Self-pay | Admitting: Emergency Medicine

## 2024-04-28 DIAGNOSIS — Z79899 Other long term (current) drug therapy: Secondary | ICD-10-CM

## 2024-04-29 ENCOUNTER — Ambulatory Visit: Payer: Self-pay

## 2024-04-29 LAB — BASIC METABOLIC PANEL WITH GFR
BUN/Creatinine Ratio: 13 (ref 12–28)
BUN: 11 mg/dL (ref 8–27)
CO2: 26 mmol/L (ref 20–29)
Calcium: 9.5 mg/dL (ref 8.7–10.3)
Chloride: 105 mmol/L (ref 96–106)
Creatinine, Ser: 0.82 mg/dL (ref 0.57–1.00)
Glucose: 95 mg/dL (ref 70–99)
Potassium: 4.1 mmol/L (ref 3.5–5.2)
Sodium: 143 mmol/L (ref 134–144)
eGFR: 74 mL/min/1.73 (ref >59)

## 2024-04-30 ENCOUNTER — Telehealth (HOSPITAL_COMMUNITY): Payer: Self-pay | Admitting: *Deleted

## 2024-04-30 NOTE — Telephone Encounter (Signed)
 Reaching out to patient to offer assistance regarding upcoming cardiac imaging study; pt verbalizes understanding of appt date/time, parking situation and where to check in, pre-test NPO status and medications ordered, and verified current allergies; name and call back number provided for further questions should they arise Sid Seats RN Navigator Cardiac Imaging [facility] [phone] office [phone] cell

## 2024-05-03 ENCOUNTER — Ambulatory Visit
Admission: RE | Admit: 2024-05-03 | Discharge: 2024-05-03 | Disposition: A | Discharge status: Home / Self Care | Source: Ambulatory Visit

## 2024-05-03 DIAGNOSIS — R072 Precordial pain: Secondary | ICD-10-CM | POA: Diagnosis present

## 2024-05-03 MED ORDER — METOPROLOL TARTRATE 5 MG/5ML IV SOLN: 10.0000 mg | Freq: Once | INTRAVENOUS | Status: DC | PRN

## 2024-05-03 MED ORDER — DILTIAZEM HCL 25 MG/5ML IV SOLN: 10.0000 mg | INTRAVENOUS | Status: DC | PRN

## 2024-05-03 MED ORDER — NITROGLYCERIN 0.4 MG SL SUBL
0.8000 mg | SUBLINGUAL_TABLET | Freq: Once | SUBLINGUAL | Status: AC
  Administered 2024-05-03: 0.8 mg via SUBLINGUAL
  Filled 2024-05-03: qty 25

## 2024-05-03 MED ORDER — IOHEXOL 350 MG/ML SOLN
100.0000 mL | Freq: Once | INTRAVENOUS | Status: AC | PRN
  Administered 2024-05-03: 100 mL via INTRAVENOUS

## 2024-05-04 NOTE — Progress Notes (Signed)
  Cardiology Office Note   Date:  05/10/2024  ID:  Sydney  D Day, Sydney Day 10-07-1945, MRN [id number] PCP: Odell Chard, Edra GRADE, MD  [facility] Providers Cardiologist:  Caron Poser, MD     History of Present Illness Sydney  D Day is a 78 y.o. female PMH HTN, HLD, former tobacco use who presents for further evaluation and management of hypertension.  Pt reports some labile BPs. She was recently trialed off of her hydrochlorothiazide  and was consistently above 150s. She went back on the medication and is now consistently 130s-140s. LDL 136 01/2024. Asymptomatic. She notes she has tried 3 different statin medications and has issues with myalgias and tolerance. She can only tolerate low dose simvastatin .  Interval history: Since last visit, echo showed mildly reduced LV function.  We obtained coronary CT angiogram which showed nonobstructive disease.  She has been asymptomatic.  No signs or symptoms of heart failure.  Relevant CVD History -Coronary CT angiogram 04/2024 CAD RADS 2 with mild stenosis proximal LAD and mild stenosis ostial to proximal RCA -TTE 03/2024 LVEF 45 to 50%, abnormal septal motion with LBBB, mild MR -Normal exercise stress MPI SPECT 2013   ROS: Pt denies any chest discomfort, jaw pain, arm pain, syncope, presyncope, orthopnea, PND, or LE edema.  Studies Reviewed I have independently reviewed the patient's ECG, recent bloodwork, and previous cardiac testing.  Physical Exam VS:  BP 138/86 (BP Location: Left Arm, Patient Position: Sitting, Cuff Size: Normal)   Pulse 75   Ht 4' 10 (1.473 m)   Wt 126 lb (57.2 kg)   SpO2 98%   BMI 26.33 kg/m        Wt Readings from Last 3 Encounters:  05/10/24 126 lb (57.2 kg)  02/19/24 123 lb 12.8 oz (56.2 kg)  02/05/24 124 lb (56.2 kg)    GEN: No acute distress. NECK: No JVD; No carotid bruits. CARDIAC: RRR, no murmurs, rubs, gallops. RESPIRATORY:  Clear to auscultation. EXTREMITIES:  Warm and  well-perfused. No edema.  ASSESSMENT AND PLAN LBBB NICM During last visit, we obtained an echocardiogram which showed mildly reduced LV function with expected abnormal septal motion.  She is NYHA I.  We obtain a coronary CTA which showed only mild nonobstructive disease.  She fortunately has no significant symptoms of heart failure.  Plan: - Continue metoprolol  to tartrate 12.5 mg twice daily (unable to tolerate XL pills) - Increase losartan  to 50 mg daily - Euvolemic, no diuretics needed - No further GDMT for now given lack of symptoms and mildly reduced EF - Can plan for repeat echo in 3 to 6 months; will coordinate with next visit  Palpitations Very infrequent, less than monthly. Self-limited and brief. I advised her to let me know if they start happening more often and we can get a monitor. Unlikely we would capture anything on a 2-week monitor currently given low frequency.  HTN Suboptimal control.  We stopped her HCTZ after discovery of her reduced EF and lieu of losartan .  As above, we will increase her losartan  to 50 mg daily.  Continue to uptitrate as needed to achieve BP goal.  Nonobstructive CAD HLD Statin intolerance Last LDL 136 last visit. She has tried numerous different statins with myalgias. Discussed adding repatha to her regimen; she is amenable to starting this.  Plan to recheck lipids at next visit.        Dispo: RTC 6 months for repeat echo  Signed, Caron Poser, MD

## 2024-05-10 ENCOUNTER — Ambulatory Visit

## 2024-05-10 VITALS — BP 138/86 | HR 75 | Ht <= 58 in | Wt 126.0 lb

## 2024-05-10 DIAGNOSIS — I447 Left bundle-branch block, unspecified: Secondary | ICD-10-CM | POA: Diagnosis not present

## 2024-05-10 DIAGNOSIS — I2510 Atherosclerotic heart disease of native coronary artery without angina pectoris: Secondary | ICD-10-CM | POA: Diagnosis not present

## 2024-05-10 DIAGNOSIS — I5020 Unspecified systolic (congestive) heart failure: Secondary | ICD-10-CM | POA: Insufficient documentation

## 2024-05-10 DIAGNOSIS — Z789 Other specified health status: Secondary | ICD-10-CM | POA: Insufficient documentation

## 2024-05-10 DIAGNOSIS — E782 Mixed hyperlipidemia: Secondary | ICD-10-CM | POA: Diagnosis present

## 2024-05-10 DIAGNOSIS — I428 Other cardiomyopathies: Secondary | ICD-10-CM | POA: Insufficient documentation

## 2024-05-10 MED ORDER — LOSARTAN POTASSIUM 50 MG PO TABS: 50.0000 mg | ORAL_TABLET | Freq: Every day | ORAL | 3 refills | Status: AC

## 2024-05-10 MED ORDER — REPATHA SURECLICK 140 MG/ML ~~LOC~~ SOAJ: 140.0000 mg | SUBCUTANEOUS | 6 refills | Status: AC

## 2024-05-10 NOTE — Patient Instructions (Addendum)
 Medication Instructions:   Your physician recommends the following medication changes.  START TAKING:  Repatha Sureclick 140 mg subcutaneous injection once every 14 days  INCREASE:  Losartan  (COZAAR ) from 25 mg to 50 mg by mouth once daily   *If you need a refill on your cardiac medications before your next appointment, please call your pharmacy*  Lab Work:  No labs ordered today   If you have labs (blood work) drawn today and your tests are completely normal, you will receive your results only by: MyChart Message (if you have MyChart) OR A paper copy in the mail If you have any lab test that is abnormal or we need to change your treatment, we will call you to review the results.  Testing/Procedures: (2 weeks prior to 6 Month Follow up)  Your physician has requested that you have an echocardiogram. Echocardiography is a painless test that uses sound waves to create images of your heart. It provides your doctor with information about the size and shape of your heart and how well your heart's chambers and valves are working.   You may receive an ultrasound enhancing agent through an IV if needed to better visualize your heart during the echo. This procedure takes approximately one hour.  There are no restrictions for this procedure.  This will take place at [address] [facility] [address] [facility]) [address], [place] [address]  Please note: We ask at that you not bring children with you during ultrasound (echo/ vascular) testing. Due to room size and safety concerns, children are not allowed in the ultrasound rooms during exams. Our front office staff cannot provide observation of children in our lobby area while testing is being conducted. An adult accompanying a patient to their appointment will only be allowed in the ultrasound room at the discretion of the ultrasound technician under special circumstances. We apologize for any inconvenience.   Follow-Up: At [facility],  you and your health needs are our priority.  As part of our continuing mission to provide you with exceptional heart care, our providers are all part of one team.  This team includes your primary Cardiologist (physician) and Advanced Practice Providers or APPs (Physician Assistants and Nurse Practitioners) who all work together to provide you with the care you need, when you need it.  Your next appointment:  6 month(s)  Provider:  Caron Poser, MD    We recommend signing up for the patient portal called MyChart.  Sign up information is provided on this After Visit Summary.  MyChart is used to connect with patients for Virtual Visits (Telemedicine).  Patients are able to view lab/test results, encounter notes, upcoming appointments, etc.  Non-urgent messages can be sent to your provider as well.   To learn more about what you can do with MyChart, go to [email or web address].

## 2024-05-12 ENCOUNTER — Telehealth: Payer: Self-pay

## 2024-05-12 DIAGNOSIS — E7849 Other hyperlipidemia: Secondary | ICD-10-CM

## 2024-05-12 NOTE — Telephone Encounter (Signed)
 Pt c/o medication issue:  1. Name of Medication:   Evolocumab (REPATHA SURECLICK) 140 MG/ML SOAJ    2. How are you currently taking this medication (dosage and times per day)? As written   3. Are you having a reaction (difficulty breathing--STAT)? no  4. What is your medication issue? Pt would like assistance with injection please advise

## 2024-05-12 NOTE — Telephone Encounter (Signed)
 Pt came by the office this afternoon to be educated on how to administer Repatha.  Pt brought her Repatha Sureclick pen; pt was shown how to pinch up the subcutaneous tissue and how to  hold the pin; pt returned demonstration and then was able to administer the medications herself; pt didn't think that medication was infusing and pulled it out; a stream of medication came out and she quickly reinserted into abdomen.  Pt is agreeable to meeting with the PharmD for another education session

## 2024-05-12 NOTE — Telephone Encounter (Signed)
 Pt said she is having a hard time pushing the needle down for the medication,

## 2024-05-12 NOTE — Telephone Encounter (Signed)
 Called patient back and gave direct instructions on how to inject via repatha website and video demonstration. Patient states she already put the medication back in the fridge but she will try the instructions again and see if that works and if not she will call back. No further questions at this time.

## 2024-05-12 NOTE — Telephone Encounter (Signed)
 Spoke with the patient, who reported having difficulty administering her injection and would like assistance. I will forward this to the MD for approval to refer the patient to the PharmD.

## 2024-05-12 NOTE — Telephone Encounter (Signed)
 Pt made aware that we have sent a referral to pharmacy team for an appointment.

## 2024-05-12 NOTE — Telephone Encounter (Signed)
 Referral placed and message sent to scheduling team.   Argentina Clap, MD to Me  Harl Heron DEL, RN (Selected Message)    05/12/24 10:14 AM Yes please, that sounds good to me

## 2024-05-12 NOTE — Telephone Encounter (Signed)
 Patient still having trouble with Repatha Injection, can she be scheduled for a nurse visit? She would like for someone to show her.

## 2024-05-12 NOTE — Telephone Encounter (Signed)
 Returned patient's call and identified using 2 identifiers; pt states that she is trying to use the Repatha Sureclick and the medication is not dispensing; I offered for the pt to come by the office to allow me to  talk her through giving herself the injection; pt is agreeable and thanked me.

## 2024-05-18 ENCOUNTER — Other Ambulatory Visit: Payer: Self-pay | Admitting: *Deleted

## 2024-05-20 ENCOUNTER — Encounter: Payer: Self-pay | Admitting: Pulmonary Disease

## 2024-05-20 ENCOUNTER — Ambulatory Visit: Admitting: Pulmonary Disease

## 2024-05-20 VITALS — BP 136/80 | HR 77 | Temp 97.6°F | Ht <= 58 in | Wt 127.0 lb

## 2024-05-20 DIAGNOSIS — R911 Solitary pulmonary nodule: Secondary | ICD-10-CM | POA: Diagnosis not present

## 2024-05-20 DIAGNOSIS — J45909 Unspecified asthma, uncomplicated: Secondary | ICD-10-CM | POA: Diagnosis not present

## 2024-05-20 DIAGNOSIS — Z87891 Personal history of nicotine dependence: Secondary | ICD-10-CM

## 2024-05-20 DIAGNOSIS — J4540 Moderate persistent asthma, uncomplicated: Secondary | ICD-10-CM

## 2024-05-20 DIAGNOSIS — J4489 Other specified chronic obstructive pulmonary disease: Secondary | ICD-10-CM

## 2024-05-20 LAB — NITRIC OXIDE: Nitric Oxide: 20

## 2024-05-20 NOTE — Progress Notes (Signed)
 Subjective:    Patient ID: Sydney Day  Sydney Day, female    DOB: Mar 26, 1946, 78 y.o.   MRN: [id number]  Patient Care Team: Odell Chard, Edra GRADE, MD as PCP - General (Family Medicine) Argentina Clap, MD as PCP - Cardiology (Cardiology) Tamea Dedra CROME, MD as Consulting Physician (Pulmonary Disease)  Chief Complaint  Patient presents with   Asthma    Cough, runny nose started over the weekend. Shortness of breath on exertion. No wheezing. Using Advair daily, at least once a day. Patient had a CT scan done on 05/03/24, and would like to go over that.     BACKGROUND/INTERVAL:Patient is a 78 year old remote former smoker who presents for follow-up of recurrent bouts of bronchitis, cough shortness of breath and wheezing. She was initially evaluated here on 04 September 2023.  Last visit here was on 19 February 2024.  This is a scheduled follow-up visit.   HPI Discussed the use of AI scribe software for clinical note transcription with the patient, who gave verbal consent to proceed.  History of Present Illness   Sydney  D Shantale Day is a 78 year old female with asthma and coronary heart disease who presents with concerns about a lung nodule and respiratory symptoms.  She reports experiencing symptoms such as rhinorrhea after spending time with her great-grandchildren, who may have had colds. No cough with sputum production is present at this time.  She has a history of asthma and sometimes forgets to use her inhaler, especially when busy with her grandchildren. She typically uses it in the morning but often forgets in the afternoon, and has not used it consistently over the past few days.  She was recently diagnosed with coronary heart disease and has been started on two heart medications and a new cholesterol-lowering injection, which she finds difficult to administer. She is still adjusting to these medications.  A recent healthcare call informed her of a small nodule found on her  right lung during tests. She has a history of smoking, having quit 20-25 years ago.  Review of her coronary CTA which is where the nodule was noted shows that this is a 3 mm right lower lobe pulmonary nodule and a perifissural nodule in the right lower lobe which is an intrapulmonary lymph node.  At present after discussion with the patient I am recommending follow-up CT in a year.  She experiences allergies, particularly with the falling leaves, and has attempted to manage yard work herself, which has been challenging. She mentions her insurance situation, having Medicare and [place], and discusses the cost of her medications, including the injection, which is expensive.     DATA 08/15/2023 nitric oxide : 27 ppb 11/14/2023 PFTs:FEV1 1.18 L or 78% predicted FVC of 1.72 L or 84% predicted FEV1/FVC of 68% predicted, there is significant bronchodilator response with FEV1 normalizing after bronchodilator. Lung volumes are normal diffusion capacity normal. The findings are more consistent with asthma than with COPD. There is a moderate obstructive airways defect with significant bronchodilator response.  11/17/2023 nitric oxide : 41 ppb  Review of Systems A 10 point review of systems was performed and it is as noted above otherwise negative.   Patient Active Problem List   Diagnosis Date Noted   Foot injury 01/01/2018   Osteoporosis 09/17/2017   Bilateral knee pain 01/01/2017   Overweight (BMI 25.0-29.9) 09/26/2016   Caregiver burden 09/26/2016   Dysfunction of left eustachian tube 09/28/2015   Palpitations 08/28/2015   Essential hypertension 07/31/2015   Stress incontinence  07/31/2015   Chest pain 12/09/2011   Hyperlipidemia     Social History   Tobacco Use   Smoking status: Former    Current packs/day: 0.00    Average packs/day: 1 pack/day for 20.0 years (20.0 ttl pk-yrs)    Types: Cigarettes    Start date: 06/19/1966    Quit date: 06/19/1986    Years since quitting: 37.9   Smokeless  tobacco: Never  Substance Use Topics   Alcohol use: No    Allergies[1]  Active Medications[2]  Immunization History  Administered Date(s) Administered   INFLUENZA, HIGH DOSE SEASONAL PF 02/19/2024   Influenza-Unspecified 03/27/2017, 04/10/2018, 04/11/2023   Pneumococcal Conjugate-13 07/31/2015   Pneumococcal Polysaccharide-23 12/18/2016   Tdap 12/02/2017        Objective:     Vitals:   05/20/24 0849  BP: 136/80  Pulse: 77  Temp: 97.6 F (36.4 C)  Height: 4' 10 (1.473 m)  Weight: 127 lb (57.6 kg)  SpO2: 98%  TempSrc: Temporal  BMI (Calculated): 26.55     SpO2: 98 %  GENERAL: Well-developed, well-nourished, short statured elderly woman, no acute distress.  Fully ambulatory, no conversational dyspnea. HEAD: Normocephalic, atraumatic.  EYES: Pupils equal, round, reactive to light.  No scleral icterus.  MOUTH: Poor dentition, oral mucosa moist.  No thrush.  NECK: Supple. No thyromegaly. Trachea midline. No JVD.  No adenopathy. CHEST: Significant kyphosis.  Increased AP diameter. PULMONARY: Good air entry bilaterally.  No adventitious sounds. CARDIOVASCULAR: S1 and S2. Regular rate and rhythm.  No rubs, murmurs or gallops heard. ABDOMEN: Benign. MUSCULOSKELETAL: No joint deformity, no clubbing, no edema.  NEUROLOGIC: No overt focal deficit, no gait disturbance, speech is fluent. SKIN: Intact,warm,dry. PSYCH: Mood and behavior normal  Lab Results  Component Value Date   NITRICOXIDE 20 05/20/2024  This result suggests low (<25) Type 2 (T2) airway inflammation indicating a low likelihood of active T2-driven airway inflammation; reduced probability of response to inhaled corticosteroids.  In a patient on asthma therapy this suggests good control of type II inflammation. *Trend: 27>>41>>28 ppb       Assessment & Plan:     ICD-10-CM   1. Moderate persistent asthma without complication  J45.40 Nitric oxide     2. Asthmatic bronchitis , chronic (HCC)  J44.89      3. Lung nodule  R91.1 CT CHEST WO CONTRAST      Orders Placed This Encounter  Procedures   CT CHEST WO CONTRAST    Standing Status:   Future    Expected Date:   05/04/2025    Expiration Date:   08/02/2025    Scheduling Instructions:     1 year follow up    Preferred imaging location?:   [facility]   Nitric oxide     Discussion:    Asthma Intermittent asthma with occasional non-compliance to inhaler use. No current cough or sputum production. Airway inflammation levels are well-managed. Difficulty obtaining a once-daily inhaler due to cost, but potential insurance changes in January may improve access. - Encouraged use of inhaler twice daily. - Advised use of over-the-counter Claritin for nasal symptoms. - Will reassess inhaler options after January for potential insurance changes.  Pulmonary nodule, right lung Small pulmonary nodule in the right lung, approximately the size of a crayon tip. No immediate concern due to its small size and her smoking history. Normal lymph node observed. - Will schedule follow-up scan in one year to monitor the nodule. - Advised to report any changes in cough or sputum  production.     Will see the patient in follow-up in 3 months time.  Advised if symptoms do not improve or worsen, to please contact office for sooner follow up or seek emergency care.    I spent 32 minutes of dedicated to the care of this patient on the date of this encounter to include pre-visit review of records, face-to-face time with the patient discussing conditions above, post visit ordering of testing, clinical documentation with the electronic health record, making appropriate referrals as documented, and communicating necessary findings to members of the patients care team.     C. Leita Sanders, MD Advanced Bronchoscopy [facility]    *This note was generated using voice recognition software/Dragon and/or AI transcription program.  Despite  best efforts to proofread, errors can occur which can change the meaning. Any transcriptional errors that result from this process are unintentional and may not be fully corrected at the time of dictation.     [1]  Allergies Allergen Reactions   Codeine     nausea   Rosuvastatin  Other (See Comments)    Muscle aches  [2]  Current Meds  Medication Sig   acetaminophen (TYLENOL) 325 MG tablet Take 325 mg by mouth every 6 (six) hours as needed.   albuterol  (VENTOLIN  HFA) 108 (90 Base) MCG/ACT inhaler Inhale 2 puffs into the lungs every 6 (six) hours as needed for wheezing or shortness of breath.   aspirin 81 MG tablet Take 81 mg by mouth daily.   Coenzyme Q10 (CO Q-10) 200 MG CAPS Take 1 tablet by mouth daily in the afternoon.   DUREZOL 0.05 % EMUL Place 1 drop into the right eye 4 (four) times daily.   Evolocumab  (REPATHA  SURECLICK) 140 MG/ML SOAJ Inject 140 mg into the skin every 14 (fourteen) days.   fluticasone -salmeterol (ADVAIR) 250-50 MCG/ACT AEPB Inhale 1 puff into the lungs in the morning and at bedtime.   losartan  (COZAAR ) 50 MG tablet Take 1 tablet (50 mg total) by mouth daily.   metoprolol  tartrate (LOPRESSOR ) 25 MG tablet Take 0.5 tablets (12.5 mg total) by mouth 2 (two) times daily. This pill can be crushed and put in food   Multiple Vitamins-Minerals (MULTIVITAMIN GUMMIES ADULT PO) Take by mouth.   omeprazole (PRILOSEC) 20 MG capsule Take 20 mg by mouth daily.   simvastatin  (ZOCOR ) 10 MG tablet Take 10 mg by mouth daily.   Vitamin D , Cholecalciferol, 25 MCG (1000 UT) TABS Take 1 tablet by mouth daily in the afternoon.   Current Facility-Administered Medications for the 05/20/24 encounter (Office Visit) with Sanders Dedra CROME, MD  Medication   denosumab  (PROLIA ) injection 60 mg

## 2024-05-20 NOTE — Patient Instructions (Signed)
 VISIT SUMMARY:  Today, we discussed your asthma management, the recent discovery of a lung nodule, and your respiratory symptoms. We also reviewed your current medications and insurance situation.  YOUR PLAN:  -ASTHMA: Asthma is a condition where your airways become inflamed and narrow, making it hard to breathe. You should use your inhaler twice daily to manage your symptoms. For your nasal symptoms, you can take over-the-counter Claritin. We will reassess your inhaler options after January when your insurance may change.  -PULMONARY NODULE, RIGHT LUNG: A pulmonary nodule is a small growth in the lung. Your nodule is about the size of a crayon tip and is not currently a concern. We will schedule a follow-up scan in one year to monitor it. Please report any changes in your cough or sputum production.  INSTRUCTIONS:  We will schedule a follow-up scan in one year to monitor the lung nodule. Please report any changes in your cough or sputum production.

## 2024-05-26 ENCOUNTER — Other Ambulatory Visit: Payer: Self-pay | Admitting: *Deleted

## 2024-05-26 DIAGNOSIS — E7849 Other hyperlipidemia: Secondary | ICD-10-CM

## 2024-05-26 DIAGNOSIS — E785 Hyperlipidemia, unspecified: Secondary | ICD-10-CM

## 2024-08-12 ENCOUNTER — Ambulatory Visit: Admitting: Pharmacist

## 2024-08-19 ENCOUNTER — Ambulatory Visit: Admitting: Pulmonary Disease

## 2024-10-22 ENCOUNTER — Ambulatory Visit
